# Patient Record
Sex: Female | Born: 1985 | Race: White | Hispanic: No | Marital: Single | Smoking: Current every day smoker
Health system: Southern US, Community
[De-identification: ages and names within clinical notes are randomized; demographics above are authoritative.]

## PROBLEM LIST (undated history)

## (undated) DIAGNOSIS — B1920 Unspecified viral hepatitis C without hepatic coma: Secondary | ICD-10-CM

## (undated) DIAGNOSIS — D329 Benign neoplasm of meninges, unspecified: Secondary | ICD-10-CM

## (undated) HISTORY — PX: TUBAL LIGATION: SHX77

## (undated) HISTORY — PX: MOUTH SURGERY: SHX715

## (undated) HISTORY — PX: OTHER SURGICAL HISTORY: SHX169

---

## 2005-09-24 ENCOUNTER — Emergency Department: Payer: Self-pay | Admitting: General Practice

## 2005-10-05 ENCOUNTER — Emergency Department: Payer: Self-pay | Admitting: Unknown Physician Specialty

## 2006-01-03 ENCOUNTER — Emergency Department: Payer: Self-pay | Admitting: Emergency Medicine

## 2006-02-16 ENCOUNTER — Emergency Department: Payer: Self-pay | Admitting: Emergency Medicine

## 2006-07-14 ENCOUNTER — Ambulatory Visit: Payer: Self-pay | Admitting: Unknown Physician Specialty

## 2006-07-21 ENCOUNTER — Ambulatory Visit: Payer: Self-pay | Admitting: Unknown Physician Specialty

## 2006-12-05 ENCOUNTER — Emergency Department: Payer: Self-pay | Admitting: Emergency Medicine

## 2007-03-02 ENCOUNTER — Emergency Department: Payer: Self-pay | Admitting: Unknown Physician Specialty

## 2007-04-09 ENCOUNTER — Emergency Department: Payer: Self-pay | Admitting: Emergency Medicine

## 2009-03-05 ENCOUNTER — Emergency Department: Payer: Self-pay | Admitting: Unknown Physician Specialty

## 2009-03-06 ENCOUNTER — Emergency Department: Payer: Self-pay | Admitting: Emergency Medicine

## 2009-12-31 ENCOUNTER — Emergency Department: Payer: Self-pay | Admitting: Emergency Medicine

## 2010-11-11 ENCOUNTER — Emergency Department: Payer: Self-pay | Admitting: Emergency Medicine

## 2018-07-17 ENCOUNTER — Emergency Department (HOSPITAL_COMMUNITY)
Admission: EM | Admit: 2018-07-17 | Discharge: 2018-07-18 | Disposition: A | Discharge status: Home / Self Care | Payer: Self-pay | Attending: Emergency Medicine | Admitting: Emergency Medicine

## 2018-07-17 ENCOUNTER — Emergency Department (HOSPITAL_COMMUNITY): Payer: Self-pay

## 2018-07-17 ENCOUNTER — Other Ambulatory Visit: Payer: Self-pay

## 2018-07-17 ENCOUNTER — Encounter (HOSPITAL_COMMUNITY): Payer: Self-pay

## 2018-07-17 DIAGNOSIS — Z91040 Latex allergy status: Secondary | ICD-10-CM | POA: Insufficient documentation

## 2018-07-17 DIAGNOSIS — M25572 Pain in left ankle and joints of left foot: Secondary | ICD-10-CM | POA: Insufficient documentation

## 2018-07-17 DIAGNOSIS — F17210 Nicotine dependence, cigarettes, uncomplicated: Secondary | ICD-10-CM | POA: Insufficient documentation

## 2018-07-17 MED ORDER — NAPROXEN 500 MG PO TABS: 500.0000 mg | ORAL_TABLET | Freq: Two times a day (BID) | ORAL | 0 refills | Status: DC

## 2018-07-17 MED ORDER — HYDROCODONE-ACETAMINOPHEN 5-325 MG PO TABS
1.0000 | ORAL_TABLET | Freq: Once | ORAL | Status: AC
  Administered 2018-07-18: 1 via ORAL
  Filled 2018-07-17: qty 1

## 2018-07-17 NOTE — ED Provider Notes (Signed)
[facility] EMERGENCY DEPARTMENT Provider Note   CSN: [id number] Arrival date & time: 07/17/18  2214     History   Chief Complaint Chief Complaint  Patient presents with  . Ankle Pain    HPI Shannon Blankenship is a 32 y.o. female with no significant past medical history presents emergency department today for left ankle pain.  Patient reports that she was "playing in the woods" when she rolled her left ankle in inversion fashion just prior to arrival.  She notes that she now has pain over the lateral malleolus that is worsened with palpation, movement of the ankle as well as ambulation.  She denies any foot pain, proximal leg pain/knee pain.  No numbness/tingling/weakness.  She reports she has broken this ankle in the past but is never required surgery.  No interventions prior to arrival.  No open wounds.  No further injury.  HPI  History reviewed. No pertinent past medical history.  There are no active problems to display for this patient.   Past Surgical History:  Procedure Laterality Date  . c-section    . MOUTH SURGERY    . TUBAL LIGATION       OB History   None      Home Medications    Prior to Admission medications   Not on File    Family History No family history on file.  Social History Social History   Tobacco Use  . Smoking status: Current Every Day Smoker    Packs/day: 1.00    Types: Cigarettes  . Smokeless tobacco: Never Used  Substance Use Topics  . Alcohol use: Never    Frequency: Never  . Drug use: Never     Allergies   Sulfa antibiotics and Latex   Review of Systems Review of Systems  Constitutional: Negative for fever.  Cardiovascular: Negative for leg swelling.  Musculoskeletal: Positive for arthralgias.  Skin: Negative for wound.  Neurological: Negative for weakness and numbness.     Physical Exam Updated Vital Signs BP 132/78 (BP Location: Right Arm)   Pulse 76   Temp 98.3 F (36.8 C) (Oral)   Resp 18   Ht 5\' 2"   (1.575 m)   Wt 70.3 kg   SpO2 100%   BMI 28.35 kg/m   Physical Exam  Constitutional: She appears well-developed and well-nourished.  HENT:  Head: Normocephalic and atraumatic.  Right Ear: External ear normal.  Left Ear: External ear normal.  Eyes: Conjunctivae are normal. Right eye exhibits no discharge. Left eye exhibits no discharge. No scleral icterus.  Cardiovascular:  Pulses:      Dorsalis pedis pulses are 2+ on the left side.       Posterior tibial pulses are 2+ on the left side.  Pulmonary/Chest: Effort normal. No respiratory distress.  Musculoskeletal:       Left knee: Normal. No tenderness found.       Left ankle: She exhibits swelling. She exhibits no ecchymosis. Decreased range of motion: able rom, decreased 2/2 pain. Tenderness. Lateral malleolus tenderness found. No posterior TFL and no head of 5th metatarsal tenderness found. Achilles tendon normal. Achilles tendon exhibits no pain and normal Thompson's test results.       Left lower leg: Normal.       Left foot: Normal. There is normal range of motion, no tenderness, no bony tenderness, no swelling, no deformity and no laceration.  Neurovascularly intact distally. Compartments soft   Neurological: She is alert. She has normal strength. No sensory  deficit.  Skin: Skin is warm, dry and intact. Capillary refill takes less than 2 seconds. No abrasion and no laceration noted. No pallor.  Psychiatric: She has a normal mood and affect.  Nursing note and vitals reviewed.    ED Treatments / Results  Labs (all labs ordered are listed, but only abnormal results are displayed) Labs Reviewed - No data to display  EKG None  Radiology Dg Ankle Complete Left  Result Date: 07/17/2018 CLINICAL DATA:  Left ankle pain, swelling EXAM: LEFT ANKLE COMPLETE - 3+ VIEW COMPARISON:  None. FINDINGS: There is no evidence of fracture, dislocation, or joint effusion. There is no evidence of arthropathy or other focal bone abnormality. Soft  tissues are unremarkable. IMPRESSION: Negative. Electronically Signed   By: Rolm Baptise M.D.   On: 07/17/2018 22:54    Procedures Procedures (including critical care time)  Medications Ordered in ED Medications  HYDROcodone-acetaminophen (NORCO/VICODIN) 5-325 MG per tablet 1 tablet (has no administration in time range)     Initial Impression / Assessment and Plan / ED Course  I have reviewed the triage vital signs and the nursing notes.  Pertinent labs & imaging results that were available during my care of the patient were reviewed by me and considered in my medical decision making (see chart for details).     32 y.o. female with likely ankle sprain.  She reports inversion ankle injury just prior to arrival.  There is no open wounds.  She is tenderness over the lateral malleolus.  X-rays are without evidence of fracture dislocation.  There is no tenderness palpation of the proximal tib/fib, knee or foot were not further x-rays.  She is neurovascular intact.  Compartments are soft.  There is no evidence of Achilles tendon injury.  Patient will be placed in ASO brace and given crutches.  Rice therapy recommended.  Referral to orthopedics given.  Pain treated in the department.  Return precautions discussed.  Final Clinical Impressions(s) / ED Diagnoses   Final diagnoses:  Acute left ankle pain    ED Discharge Orders         Ordered    naproxen (NAPROSYN) 500 MG tablet  2 times daily     07/17/18 2337           Jillyn Ledger, Hershal Coria 07/17/18 2340    Merryl Hacker, MD 07/18/18 [phone]

## 2018-07-17 NOTE — Discharge Instructions (Signed)
Please read and follow all provided instructions.  Your diagnoses today include: Ankle sprain An ankle sprain is an injury to the ligaments that hold the ankle joint together causing them to get stretched or torn. It may take 4-6 weeks to heal fully. Your X-ray today showed no evidence of fracture (there is no evidence of broken bones).  For activity: Use crutches with non-weightbearing for the first few days. Exercises should be limited to pain free range of motion. You can start mobilization by tracing the alphabet with you foot in the air. Then, you may walk on your ankles as the pain allows (or as instructed). Start gradually with weight bearing on the affected ankle. Once you can walk pain free, then try jogging. When you can run forwards, then you can try moving side to side. If you cannot walk without crutches in one week, you need a recheck by your Family Doctor. It is important to keep all follow-up appointments as we discussed fractures may not appear until 1 week to 10 days after the acute injury. If you do not have a family doctor to follow-up with, you can see the list of phone numbers below. Please call today to make a followup appointment.  TREATMENT  Rest, ice, compression and elevation (RICE therapy) are the basic modes of treatment.   Rest is needed to allow your body to heal. Routine activities can be resumed when comfortable (as described above). Injury tendons and bones can take up to 6 weeks to heal. Tendons are cordlike structures that attach muscles and bones Ice: Apply ice to the sore area for 15 to 20 minutes, 3 to 4 times per day. Do this while you are awake for the first 2 days, or as directed. This can help reduce swelling and reduce pain.  Compression: this helps keep swelling down. It also gives support and helps with discomfort. If any lasting bandage has been applied, it should be removed and reapplied every 3-4 hours. It should not be applied tightly, but firmly enough to  keep swelling down. Watch fingers or toes for swelling, discoloration, coldness, numbness or excessive pain. If any of these problems occur, removed the bandage and reapply loosely. Contact your caregiver if these problems continue. If you were given an ankle stabilizer you may take it off at night and to take a shower or bath. Wiggle your toes in the splint several times per day if you are able.  Elevation helps reduce swelling and decrease your pain. With extremities such as the arms, hands, legs and feet, the injured area should be placed near or above the level of the heart if possible (place pillows underneath you leg/foot while you sleep to achieve this).  HOW TO MAKE AN ICE PACK  To make an ice pack, do one of the following:  Place crushed ice or a bag of frozen vegetables in a sealable plastic bag. Squeeze out the excess air. Place this bag inside another plastic bag. Slide the bag into a pillowcase or place a damp towel between your skin and the bag.  Mix 3 parts water with 1 part rubbing alcohol. Freeze the mixture in a sealable plastic bag. When you remove the mixture from the freezer, it will be slushy. Squeeze out the excess air. Place this bag inside another plastic bag. Slide the bag into a pillowcase or place a damp towel between your sk  Seek immediate medical attention if: You're toes are numb or tingling, appear gray or blue, or  you have severe pain. If this occurs please also elevate the leg and loosen the splint. Also if you have persistent pain and swelling, developed redness numbness or unexpected weakness, or your symptoms are getting worse rather than improving after several days. The symptoms may indicate that further evaluation or further x-rays are needed. Sometimes, x-rays may not show a small broken bone until one week or 10 days later. Make a follow-up appointment with your caregiver.  Additional Information:   Take naproxen with food. Do not take if you think you may be  pregnant  Your vital signs today were: BP 132/78 (BP Location: Right Arm)    Pulse 76    Temp 98.3 F (36.8 C) (Oral)    Resp 18    Ht 5\' 2"  (1.575 m)    Wt 70.3 kg    SpO2 100%    BMI 28.35 kg/m  If your blood pressure (BP) was elevated above 135/85 this visit, please have this repeated by your doctor within one month. ---------------

## 2018-07-17 NOTE — ED Triage Notes (Signed)
Pt came to ED with complaints of left ankle pain. Pt states she broke her ankle a couple months ago and today she "rolled her ankle." Pt states she has noticed swelling and pain in the ankle for 2 weeks.

## 2018-07-18 NOTE — ED Notes (Signed)
Pt demonstrated correct use of crutches

## 2018-07-29 ENCOUNTER — Other Ambulatory Visit: Payer: Self-pay

## 2018-07-29 ENCOUNTER — Emergency Department (HOSPITAL_COMMUNITY)
Admission: EM | Admit: 2018-07-29 | Discharge: 2018-07-29 | Disposition: A | Discharge status: Home / Self Care | Payer: Self-pay | Attending: Emergency Medicine | Admitting: Emergency Medicine

## 2018-07-29 ENCOUNTER — Encounter (HOSPITAL_COMMUNITY): Payer: Self-pay

## 2018-07-29 DIAGNOSIS — R102 Pelvic and perineal pain: Secondary | ICD-10-CM | POA: Insufficient documentation

## 2018-07-29 DIAGNOSIS — N938 Other specified abnormal uterine and vaginal bleeding: Secondary | ICD-10-CM | POA: Insufficient documentation

## 2018-07-29 DIAGNOSIS — Z91040 Latex allergy status: Secondary | ICD-10-CM | POA: Insufficient documentation

## 2018-07-29 DIAGNOSIS — N939 Abnormal uterine and vaginal bleeding, unspecified: Secondary | ICD-10-CM

## 2018-07-29 DIAGNOSIS — F17210 Nicotine dependence, cigarettes, uncomplicated: Secondary | ICD-10-CM | POA: Insufficient documentation

## 2018-07-29 LAB — CBC
HCT: 39.6 % (ref 36.0–46.0)
Hemoglobin: 13.2 g/dL (ref 12.0–15.0)
MCH: 28.4 pg (ref 26.0–34.0)
MCHC: 33.3 g/dL (ref 30.0–36.0)
MCV: 85.2 fL (ref 80.0–100.0)
Platelets: 353 10*3/uL (ref 150–400)
RBC: 4.65 MIL/uL (ref 3.87–5.11)
RDW: 18.4 % — ABNORMAL HIGH (ref 11.5–15.5)
WBC: 12.6 10*3/uL — AB (ref 4.0–10.5)
nRBC: 0 % (ref 0.0–0.2)

## 2018-07-29 LAB — URINALYSIS, ROUTINE W REFLEX MICROSCOPIC
BILIRUBIN URINE: NEGATIVE
Bacteria, UA: NONE SEEN
Glucose, UA: NEGATIVE mg/dL
Ketones, ur: NEGATIVE mg/dL
Leukocytes, UA: NEGATIVE
NITRITE: NEGATIVE
Protein, ur: NEGATIVE mg/dL
Specific Gravity, Urine: 1.009 (ref 1.005–1.030)
pH: 6 (ref 5.0–8.0)

## 2018-07-29 LAB — I-STAT BETA HCG BLOOD, ED (MC, WL, AP ONLY): I-stat hCG, quantitative: <5 m[IU]/mL (ref <5)

## 2018-07-29 NOTE — ED Triage Notes (Signed)
Pt states she is approx [redacted] weeks pregnant, states she started passing blood approx 5 pm with some small clots.  Pt also having some cramping.

## 2018-07-29 NOTE — Discharge Instructions (Signed)
Ibuprofen for pain Health department tomorrow for further counseling on pregnancy prevention

## 2018-07-29 NOTE — ED Provider Notes (Signed)
[facility] EMERGENCY DEPARTMENT Provider Note   CSN: [id number] Arrival date & time: 07/29/18  0001    History   Chief Complaint Chief Complaint  Patient presents with  . Vaginal Bleeding    HPI Shannon Blankenship is a 32 y.o. female.  HPI  The patient is a 32 year old female, she has a prior history of a cesarean section that was complicated, she went into cardiac arrest, she subsequently had a tubal ligation in the state of [place].  That was in 2014.  Since that time the patient reports that she has been pregnant twice, initially last year where she carried a baby to [redacted] weeks gestation and subsequently this month where she took an at home pregnancy test, had this pregnancy confirmed in the office and was scheduled for further testing.  That being said the patient this evening developed acute onset of pain and bleeding in her pelvis both the right and left side.  The bleeding was initially very heavily, it then became lighter.  The cramping has persisted.  It is not associated with vomiting diarrhea or fevers.  She has been told that she has lots of scar tissue in her pelvis tubes and uterus because of the prior surgeries and tubal ligations.  History reviewed. No pertinent past medical history.  There are no active problems to display for this patient.   Past Surgical History:  Procedure Laterality Date  . c-section    . MOUTH SURGERY    . TUBAL LIGATION       OB History    Gravida  1   Para      Term      Preterm      AB      Living        SAB      TAB      Ectopic      Multiple      Live Births               Home Medications    Prior to Admission medications   Medication Sig Start Date End Date Taking? Authorizing Provider  naproxen (NAPROSYN) 500 MG tablet Take 1 tablet (500 mg total) by mouth 2 (two) times daily. 07/17/18   Maczis, Barth Kirks, PA-C    Family History No family history on file.  Social History Social History    Tobacco Use  . Smoking status: Current Every Day Smoker    Packs/day: 1.00    Types: Cigarettes  . Smokeless tobacco: Never Used  Substance Use Topics  . Alcohol use: Never    Frequency: Never  . Drug use: Never     Allergies   Sulfa antibiotics and Latex   Review of Systems Review of Systems  All other systems reviewed and are negative.    Physical Exam Updated Vital Signs BP 131/75 (BP Location: Left Arm)   Pulse 70   Temp 97.9 F (36.6 C) (Oral)   Resp 16   Ht 1.575 m (5\' 2" )   Wt 70.3 kg   LMP 06/12/2018 (Exact Date)   SpO2 99%   BMI 28.35 kg/m   Physical Exam  Constitutional: She appears well-developed and well-nourished. No distress.  HENT:  Head: Normocephalic and atraumatic.  Mouth/Throat: Oropharynx is clear and moist. No oropharyngeal exudate.  Eyes: Pupils are equal, round, and reactive to light. Conjunctivae and EOM are normal. Right eye exhibits no discharge. Left eye exhibits no discharge. No scleral icterus.  Neck: Normal  range of motion. Neck supple. No JVD present. No thyromegaly present.  Cardiovascular: Normal rate, regular rhythm, normal heart sounds and intact distal pulses. Exam reveals no gallop and no friction rub.  No murmur heard. Pulmonary/Chest: Effort normal and breath sounds normal. No respiratory distress. She has no wheezes. She has no rales.  Abdominal: Soft. Bowel sounds are normal. She exhibits no distension and no mass. There is tenderness ( There is focal tenderness to palpation in the lower abdomen as well as the right and left lower quadrants, no upper abdominal tenderness, very soft, no peritoneal signs).  Genitourinary:  Genitourinary Comments: Chaperone present for exam, normal-appearing external genitalia, internal vaginal vault with small to moderate amount of blood, cervical loss is slightly open, blood present, no foreign bodies, tenderness present  Musculoskeletal: Normal range of motion. She exhibits no edema or  tenderness.  Lymphadenopathy:    She has no cervical adenopathy.  Neurological: She is alert. Coordination normal.  Skin: Skin is warm and dry. No rash noted. No erythema.  Psychiatric: She has a normal mood and affect. Her behavior is normal.  Nursing note and vitals reviewed.    ED Treatments / Results  Labs (all labs ordered are listed, but only abnormal results are displayed) Labs Reviewed  URINALYSIS, ROUTINE W REFLEX MICROSCOPIC  CBC  I-STAT BETA HCG BLOOD, ED ([facility], [facility], [facility] ONLY)  ABO/RH    Radiology No results found.  Procedures Procedures (including critical care time)  Medications Ordered in ED Medications - No data to display   Initial Impression / Assessment and Plan / ED Course  I have reviewed the triage vital signs and the nursing notes.  Pertinent labs & imaging results that were available during my care of the patient were reviewed by me and considered in my medical decision making (see chart for details).  Clinical Course as of Jul 29 52  Sat Jul 29, 2018  0051 hCG quant came back undetectable, the patient has no signs of pregnancy at this time.  This is likely menstrual bleeding.  She now states that her initial test was positive but a subsequent test was negative.  At this time I do not think ectopic pregnancy is a concern.  I have done a bedside ultrasound, the images were not archived but there is no free fluid in the pelvis, bladder appeared normal, uterus appeared normal.  Patient was given reassurance.  She will follow-up to have further counseling on further contraceptive use to prevent pregnancy and complicated pregnancy.   [BM]    Clinical Course User Index [BM] Noemi Chapel, MD    The patient will need an ectopic evaluation. Ultrasound technician called in, pain and bleeding on pelvic exam  Final Clinical Impressions(s) / ED Diagnoses   Final diagnoses:  Vaginal bleeding      Noemi Chapel, MD 07/29/18 [phone]

## 2018-11-05 ENCOUNTER — Other Ambulatory Visit: Payer: Self-pay

## 2018-11-05 ENCOUNTER — Emergency Department
Admission: EM | Admit: 2018-11-05 | Discharge: 2018-11-05 | Disposition: A | Discharge status: Home / Self Care | Payer: Self-pay | Attending: Emergency Medicine | Admitting: Emergency Medicine

## 2018-11-05 DIAGNOSIS — N898 Other specified noninflammatory disorders of vagina: Secondary | ICD-10-CM | POA: Insufficient documentation

## 2018-11-05 DIAGNOSIS — R350 Frequency of micturition: Secondary | ICD-10-CM | POA: Insufficient documentation

## 2018-11-05 DIAGNOSIS — Z91040 Latex allergy status: Secondary | ICD-10-CM | POA: Insufficient documentation

## 2018-11-05 DIAGNOSIS — F17210 Nicotine dependence, cigarettes, uncomplicated: Secondary | ICD-10-CM | POA: Insufficient documentation

## 2018-11-05 LAB — URINALYSIS, COMPLETE (UACMP) WITH MICROSCOPIC
BACTERIA UA: NONE SEEN
Bilirubin Urine: NEGATIVE
Glucose, UA: NEGATIVE mg/dL
KETONES UR: NEGATIVE mg/dL
LEUKOCYTES UA: NEGATIVE
NITRITE: NEGATIVE
PROTEIN: NEGATIVE mg/dL
Specific Gravity, Urine: 1.028 (ref 1.005–1.030)
pH: 5 (ref 5.0–8.0)

## 2018-11-05 LAB — POCT PREGNANCY, URINE: PREG TEST UR: NEGATIVE

## 2018-11-05 NOTE — ED Triage Notes (Signed)
Pt c/o hx of kidney stones - reports dysuria x2 days with urgency/frequency and voiding in small amount

## 2018-11-05 NOTE — ED Notes (Signed)
See triage note   Presents with diff voiding   States sx's started 2 days ago  No fever or vaginal discharge

## 2018-11-05 NOTE — Discharge Instructions (Addendum)
Follow-up with your primary care provider at [facility] clinic if any continued problems.  Your urinalysis today in the ED did not show any signs of infection or signs of kidney stone. Also consider using a menstrual disc however read the information on the package to make sure that it does not contain latex as this may be part of the irritation that you are experiencing.

## 2018-11-05 NOTE — ED Provider Notes (Signed)
[facility] Emergency Department Provider Note  ____________________________________________   First MD Initiated Contact with Patient 11/05/18 0831     (approximate)  I have reviewed the triage vital signs and the nursing notes.   HISTORY  Chief Complaint Dysuria   HPI Shannon Blankenship is a 33 y.o. female presents to the ED today with complaint of dysuria for 2 days with initial history of frequency and voiding in small amounts.  Patient denied any fever or vaginal discharge.  She states symptoms began 2 days ago.  When talking to her and more depth she states that this occurs each time she has her menses and that she actually has irritation without urinary symptoms.  She is concerned that she is having some problems with pads and has discontinued using tampons as they are too painful.    History reviewed. No pertinent past medical history.  There are no active problems to display for this patient.   Past Surgical History:  Procedure Laterality Date  . c-section    . MOUTH SURGERY    . TUBAL LIGATION      Prior to Admission medications   Not on File    Allergies Sulfa antibiotics and Latex  No family history on file.  Social History Social History   Tobacco Use  . Smoking status: Current Every Day Smoker    Packs/day: 1.00    Types: Cigarettes  . Smokeless tobacco: Never Used  Substance Use Topics  . Alcohol use: Never    Frequency: Never  . Drug use: Never    Review of Systems Constitutional: No fever/chills Cardiovascular: Denies chest pain. Respiratory: Denies shortness of breath. Gastrointestinal: No abdominal pain.  No nausea, no vomiting.  No diarrhea.  No constipation. Genitourinary: Negative for dysuria.  Positive for vaginal irritation.  Negative for vaginal discharge or itching. Musculoskeletal: Negative for back pain. Skin: Negative for rash. Neurological: Negative for headaches, focal weakness or  numbness. ____________________________________________   PHYSICAL EXAM:  VITAL SIGNS: ED Triage Vitals  Enc Vitals Group     BP 11/05/18 0824 119/77     Pulse Rate 11/05/18 0824 74     Resp 11/05/18 0824 16     Temp 11/05/18 0824 98.1 F (36.7 C)     Temp Source 11/05/18 0824 Oral     SpO2 11/05/18 0824 99 %     Weight 11/05/18 0822 151 lb (68.5 kg)     Height 11/05/18 0822 5\' 2"  (1.575 m)     Head Circumference --      Peak Flow --      Pain Score 11/05/18 0821 7     Pain Loc --      Pain Edu? --      Excl. in [place]? --    Constitutional: Alert and oriented. Well appearing and in no acute distress. Eyes: Conjunctivae are normal. PERRL. EOMI. Head: Atraumatic. Nose: No congestion/rhinnorhea. Neck: No stridor.   Respiratory: Normal respiratory effort.  No retractions. Musculoskeletal: Moves upper and lower extremities and normal gait was noted. Neurologic:  Normal speech and language. No gross focal neurologic deficits are appreciated.  Skin:  Skin is warm, dry and intact.  Psychiatric: Mood and affect are normal. Speech and behavior are normal.  ____________________________________________   LABS (all labs ordered are listed, but only abnormal results are displayed)  Labs Reviewed  URINALYSIS, COMPLETE (UACMP) WITH MICROSCOPIC - Abnormal; Notable for the following components:      Result Value  Color, Urine YELLOW (*)    APPearance HAZY (*)    Hgb urine dipstick SMALL (*)    All other components within normal limits  POC URINE PREG, ED  POCT PREGNANCY, URINE     PROCEDURES  Procedure(s) performed: None  Procedures  Critical Care performed: No  ____________________________________________   INITIAL IMPRESSION / ASSESSMENT AND PLAN / ED COURSE  As part of my medical decision making, I reviewed the following data within the electronic MEDICAL RECORD NUMBER Notes from prior ED visits and [place] Controlled Substance Database  Patient presents to the ED initially  with history of dysuria for 2 days.  In talking with her it is more of a irritation due to wearing pads which may contain latex.  Patient states that she has allergies to latex and experiences irritation each time she has her period every month.  We discussed other options for her to look into.  Urinalysis was reassuring and patient was made aware.  ____________________________________________   FINAL CLINICAL IMPRESSION(S) / ED DIAGNOSES  Final diagnoses:  Vaginal irritation     ED Discharge Orders    None       Note:  This document was prepared using Dragon voice recognition software and may include unintentional dictation errors.    Johnn Hai, PA-C 11/05/18 1210    Delman Kitten, MD 11/05/18 [phone]

## 2019-01-23 ENCOUNTER — Other Ambulatory Visit: Payer: Self-pay

## 2019-01-23 ENCOUNTER — Emergency Department
Admission: EM | Admit: 2019-01-23 | Discharge: 2019-01-23 | Disposition: A | Discharge status: Home / Self Care | Payer: HRSA Program | Attending: Emergency Medicine | Admitting: Emergency Medicine

## 2019-01-23 ENCOUNTER — Emergency Department: Payer: HRSA Program

## 2019-01-23 DIAGNOSIS — J329 Chronic sinusitis, unspecified: Secondary | ICD-10-CM | POA: Diagnosis not present

## 2019-01-23 DIAGNOSIS — R0981 Nasal congestion: Secondary | ICD-10-CM | POA: Diagnosis present

## 2019-01-23 DIAGNOSIS — R519 Headache, unspecified: Secondary | ICD-10-CM

## 2019-01-23 DIAGNOSIS — F17210 Nicotine dependence, cigarettes, uncomplicated: Secondary | ICD-10-CM | POA: Diagnosis not present

## 2019-01-23 DIAGNOSIS — Z91040 Latex allergy status: Secondary | ICD-10-CM | POA: Diagnosis not present

## 2019-01-23 DIAGNOSIS — H9202 Otalgia, left ear: Secondary | ICD-10-CM

## 2019-01-23 DIAGNOSIS — R51 Headache: Secondary | ICD-10-CM | POA: Diagnosis not present

## 2019-01-23 DIAGNOSIS — H9209 Otalgia, unspecified ear: Secondary | ICD-10-CM | POA: Insufficient documentation

## 2019-01-23 DIAGNOSIS — Z20828 Contact with and (suspected) exposure to other viral communicable diseases: Secondary | ICD-10-CM | POA: Diagnosis not present

## 2019-01-23 MED ORDER — HYDROCOD POLST-CPM POLST ER 10-8 MG/5ML PO SUER: 5.0000 mL | Freq: Two times a day (BID) | ORAL | 0 refills | Status: DC

## 2019-01-23 MED ORDER — AZITHROMYCIN 250 MG PO TABS: ORAL_TABLET | ORAL | 0 refills | Status: DC

## 2019-01-23 MED ORDER — ONDANSETRON 4 MG PO TBDP
4.0000 mg | ORAL_TABLET | Freq: Once | ORAL | Status: AC
  Administered 2019-01-23: 11:00:00 4 mg via ORAL
  Filled 2019-01-23: qty 1

## 2019-01-23 MED ORDER — BUTALBITAL-APAP-CAFFEINE 50-325-40 MG PO TABS: 1.0000 | ORAL_TABLET | Freq: Four times a day (QID) | ORAL | 0 refills | Status: AC | PRN

## 2019-01-23 MED ORDER — KETOROLAC TROMETHAMINE 60 MG/2ML IM SOLN
60.0000 mg | Freq: Once | INTRAMUSCULAR | Status: AC
  Administered 2019-01-23: 60 mg via INTRAMUSCULAR
  Filled 2019-01-23: qty 2

## 2019-01-23 NOTE — ED Triage Notes (Signed)
Pt c/o sinus congestion with ear ache for the past several days, since last night having a fever with N/V.Shannon Blankenship

## 2019-01-23 NOTE — ED Notes (Signed)
Pt states migraine x 3 days with N&V. C/o L ear pain. Biggest c/o is L ear pain with nasal congestion. A&O, ambulatory. No distress noted.   EDP at bedside.

## 2019-01-23 NOTE — ED Provider Notes (Signed)
[facility] Emergency Department Provider Note       Time seen: ----------------------------------------- 10:11 AM on 01/23/2019 -----------------------------------------   I have reviewed the triage vital signs and the nursing notes.  HISTORY   Chief Complaint Otalgia; Fever; and Cough    HPI Shannon Blankenship is a 33 y.o. female with no significant past medical history who presents to the ED for sinus congestion with earache for the past several days.  Patient states last night she started having a fever with nausea and vomiting.  Patient feels like she is developing a migraine headache.  No past medical history on file.  There are no active problems to display for this patient.   Past Surgical History:  Procedure Laterality Date  . c-section    . MOUTH SURGERY    . TUBAL LIGATION      Allergies Sulfa antibiotics and Latex  Social History Social History   Tobacco Use  . Smoking status: Current Every Day Smoker    Packs/day: 1.00    Types: Cigarettes  . Smokeless tobacco: Never Used  Substance Use Topics  . Alcohol use: Never    Frequency: Never  . Drug use: Never   Review of Systems Constitutional: Positive for fever HEENT: Positive for earache, congestion Cardiovascular: Negative for chest pain. Respiratory: Negative for shortness of breath. Gastrointestinal: Positive for vomiting Musculoskeletal: Negative for back pain. Skin: Negative for rash. Neurological: Negative for headaches, focal weakness or numbness.  All systems negative/normal/unremarkable except as stated in the HPI  ____________________________________________   PHYSICAL EXAM:  VITAL SIGNS: ED Triage Vitals  Enc Vitals Group     BP      Pulse      Resp      Temp      Temp src      SpO2      Weight      Height      Head Circumference      Peak Flow      Pain Score      Pain Loc      Pain Edu?      Excl. in [place]?    Constitutional: Alert and  oriented. Well appearing and in no distress. Eyes: Conjunctivae are normal. Normal extraocular movements. ENT      Head: Normocephalic and atraumatic.      Ears: TMs are clear bilaterally      Nose: No congestion/rhinnorhea.      Mouth/Throat: Mucous membranes are moist.      Neck: No stridor. Cardiovascular: Normal rate, regular rhythm. No murmurs, rubs, or gallops. Respiratory: Normal respiratory effort without tachypnea nor retractions. Breath sounds are clear and equal bilaterally. No wheezes/rales/rhonchi. Gastrointestinal: Soft and nontender. Normal bowel sounds Musculoskeletal: Nontender with normal range of motion in extremities. No lower extremity tenderness nor edema. Neurologic:  Normal speech and language. No gross focal neurologic deficits are appreciated.  Skin:  Skin is warm, dry and intact. No rash noted. Psychiatric: Mood and affect are normal. Speech and behavior are normal.  ____________________________________________  ED COURSE:  As part of my medical decision making, I reviewed the following data within the [place] History obtained from family if available, nursing notes, old chart and ekg, as well as notes from prior ED visits. Patient presented for multiple complaints, we will assess with labs and imaging as indicated at this time.   Procedures  Shannon Blankenship was evaluated in Emergency Department on 01/23/2019 for the symptoms described  in the history of present illness. She was evaluated in the context of the global COVID-19 pandemic, which necessitated consideration that the patient might be at risk for infection with the SARS-CoV-2 virus that causes COVID-19. Institutional protocols and algorithms that pertain to the evaluation of patients at risk for COVID-19 are in a state of rapid change based on information released by regulatory bodies including the CDC and federal and state organizations. These policies and algorithms were followed during  the patient's care in the ED.  ____________________________________________   LABS (pertinent positives/negatives)  Labs Reviewed  NOVEL CORONAVIRUS, NAA (HOSPITAL ORDER, SEND-OUT TO REF LAB)    RADIOLOGY Images were viewed by me  Chest x-ray Is unremarkable ____________________________________________   DIFFERENTIAL DIAGNOSIS   Viral illness, sinusitis, otitis media, gastroenteritis  FINAL ASSESSMENT AND PLAN  Sinusitis, otalgia, migraine headache  Plan: The patient had presented for congestion, earache with fever, nausea and vomiting. Patient's imaging not reveal any acute process.  She was given Toradol for headache and Zofran.  She will be discharged with decongestants and is cleared for outpatient follow-up.   Laurence Aly, MD    Note: This note was generated in part or whole with voice recognition software. Voice recognition is usually quite accurate but there are transcription errors that can and very often do occur. I apologize for any typographical errors that were not detected and corrected.     Earleen Newport, MD 01/23/19 1058

## 2019-01-24 LAB — NOVEL CORONAVIRUS, NAA (HOSP ORDER, SEND-OUT TO REF LAB; TAT 18-24 HRS): SARS-CoV-2, NAA: NOT DETECTED

## 2019-01-25 ENCOUNTER — Telehealth: Payer: Self-pay | Admitting: Emergency Medicine

## 2019-01-25 NOTE — Telephone Encounter (Signed)
Called patient and informed her of negative covid 19 test.

## 2019-05-07 ENCOUNTER — Encounter: Payer: Self-pay | Admitting: Emergency Medicine

## 2019-05-07 ENCOUNTER — Other Ambulatory Visit: Payer: Self-pay

## 2019-05-07 DIAGNOSIS — R509 Fever, unspecified: Secondary | ICD-10-CM | POA: Insufficient documentation

## 2019-05-07 DIAGNOSIS — Z5321 Procedure and treatment not carried out due to patient leaving prior to being seen by health care provider: Secondary | ICD-10-CM | POA: Insufficient documentation

## 2019-05-07 NOTE — ED Triage Notes (Signed)
Patient ambulatory to triage with steady gait, without difficulty or distress noted; pt reports fever, prod cough green sputum and chills, st broke 2 teeth this week as well and having dental pain (rt upper side) multiple dental cavities noted--st rx antibiotics at [facility] last week but "couldn't afford them"; +smoker

## 2019-05-07 NOTE — ED Notes (Signed)
Pt left ED lobby

## 2019-05-08 ENCOUNTER — Emergency Department
Admission: EM | Admit: 2019-05-08 | Discharge: 2019-05-08 | Disposition: A | Discharge status: Home / Self Care | Payer: Self-pay | Attending: Emergency Medicine | Admitting: Emergency Medicine

## 2019-05-08 NOTE — ED Notes (Signed)
No answer when called x 2

## 2019-06-16 ENCOUNTER — Emergency Department
Admission: EM | Admit: 2019-06-16 | Discharge: 2019-06-16 | Disposition: A | Discharge status: Home / Self Care | Payer: Self-pay | Attending: Emergency Medicine | Admitting: Emergency Medicine

## 2019-06-16 ENCOUNTER — Other Ambulatory Visit: Payer: Self-pay

## 2019-06-16 ENCOUNTER — Emergency Department: Payer: Self-pay

## 2019-06-16 DIAGNOSIS — Z91040 Latex allergy status: Secondary | ICD-10-CM | POA: Insufficient documentation

## 2019-06-16 DIAGNOSIS — R112 Nausea with vomiting, unspecified: Secondary | ICD-10-CM | POA: Insufficient documentation

## 2019-06-16 DIAGNOSIS — R1084 Generalized abdominal pain: Secondary | ICD-10-CM | POA: Insufficient documentation

## 2019-06-16 DIAGNOSIS — F17210 Nicotine dependence, cigarettes, uncomplicated: Secondary | ICD-10-CM | POA: Insufficient documentation

## 2019-06-16 HISTORY — DX: Benign neoplasm of meninges, unspecified: D32.9

## 2019-06-16 HISTORY — DX: Unspecified viral hepatitis C without hepatic coma: B19.20

## 2019-06-16 LAB — URINALYSIS, COMPLETE (UACMP) WITH MICROSCOPIC
Bilirubin Urine: NEGATIVE
Glucose, UA: NEGATIVE mg/dL
Hgb urine dipstick: NEGATIVE
Ketones, ur: NEGATIVE mg/dL
Leukocytes,Ua: NEGATIVE
Nitrite: NEGATIVE
Protein, ur: 30 mg/dL — AB
Specific Gravity, Urine: 1.017 (ref 1.005–1.030)
Squamous Epithelial / HPF: >50 — ABNORMAL HIGH (ref 0–5)
pH: 6 (ref 5.0–8.0)

## 2019-06-16 LAB — COMPREHENSIVE METABOLIC PANEL WITH GFR
ALT: 24 U/L (ref 0–44)
AST: 22 U/L (ref 15–41)
Albumin: 3.4 g/dL — ABNORMAL LOW (ref 3.5–5.0)
Alkaline Phosphatase: 78 U/L (ref 38–126)
Anion gap: 8 (ref 5–15)
BUN: 6 mg/dL (ref 6–20)
CO2: 25 mmol/L (ref 22–32)
Calcium: 8.6 mg/dL — ABNORMAL LOW (ref 8.9–10.3)
Chloride: 103 mmol/L (ref 98–111)
Creatinine, Ser: 0.54 mg/dL (ref 0.44–1.00)
GFR calc Af Amer: >60 mL/min
GFR calc non Af Amer: >60 mL/min
Glucose, Bld: 94 mg/dL (ref 70–99)
Potassium: 3 mmol/L — ABNORMAL LOW (ref 3.5–5.1)
Sodium: 136 mmol/L (ref 135–145)
Total Bilirubin: 0.3 mg/dL (ref 0.3–1.2)
Total Protein: 7.3 g/dL (ref 6.5–8.1)

## 2019-06-16 LAB — CBC
HCT: 31.4 % — ABNORMAL LOW (ref 36.0–46.0)
Hemoglobin: 9.6 g/dL — ABNORMAL LOW (ref 12.0–15.0)
MCH: 22.9 pg — ABNORMAL LOW (ref 26.0–34.0)
MCHC: 30.6 g/dL (ref 30.0–36.0)
MCV: 74.9 fL — ABNORMAL LOW (ref 80.0–100.0)
Platelets: 372 K/uL (ref 150–400)
RBC: 4.19 MIL/uL (ref 3.87–5.11)
RDW: 16.9 % — ABNORMAL HIGH (ref 11.5–15.5)
WBC: 6.3 K/uL (ref 4.0–10.5)
nRBC: 0 % (ref 0.0–0.2)

## 2019-06-16 LAB — POCT PREGNANCY, URINE: Preg Test, Ur: NEGATIVE

## 2019-06-16 LAB — AMMONIA: Ammonia: 25 umol/L (ref 9–35)

## 2019-06-16 LAB — LIPASE, BLOOD: Lipase: 16 U/L (ref 11–51)

## 2019-06-16 MED ORDER — SODIUM CHLORIDE 0.9 % IV BOLUS
1000.0000 mL | Freq: Once | INTRAVENOUS | Status: AC
  Administered 2019-06-16: 1000 mL via INTRAVENOUS

## 2019-06-16 MED ORDER — FENTANYL CITRATE (PF) 100 MCG/2ML IJ SOLN
50.0000 ug | Freq: Once | INTRAMUSCULAR | Status: AC
  Administered 2019-06-16: 50 ug via INTRAVENOUS
  Filled 2019-06-16: qty 2

## 2019-06-16 MED ORDER — IOHEXOL 300 MG/ML  SOLN
100.0000 mL | Freq: Once | INTRAMUSCULAR | Status: AC | PRN
  Administered 2019-06-16: 07:00:00 100 mL via INTRAVENOUS

## 2019-06-16 MED ORDER — ONDANSETRON 4 MG PO TBDP: 4.0000 mg | ORAL_TABLET | Freq: Three times a day (TID) | ORAL | 0 refills | Status: DC | PRN

## 2019-06-16 MED ORDER — ONDANSETRON HCL 4 MG/2ML IJ SOLN
4.0000 mg | Freq: Once | INTRAMUSCULAR | Status: AC
  Administered 2019-06-16: 06:00:00 4 mg via INTRAVENOUS
  Filled 2019-06-16: qty 2

## 2019-06-16 MED ORDER — ONDANSETRON HCL 4 MG/2ML IJ SOLN
4.0000 mg | Freq: Once | INTRAMUSCULAR | Status: AC
  Administered 2019-06-16: 08:00:00 4 mg via INTRAVENOUS

## 2019-06-16 MED ORDER — FAMOTIDINE 20 MG PO TABS: 20.0000 mg | ORAL_TABLET | Freq: Two times a day (BID) | ORAL | 0 refills | Status: DC

## 2019-06-16 MED ORDER — ONDANSETRON HCL 4 MG/2ML IJ SOLN
INTRAMUSCULAR | Status: AC
  Filled 2019-06-16: qty 2

## 2019-06-16 MED ORDER — IOHEXOL 9 MG/ML PO SOLN
500.0000 mL | ORAL | Status: AC
  Administered 2019-06-16: 05:00:00 500 mL via ORAL

## 2019-06-16 NOTE — ED Provider Notes (Signed)
[facility] Emergency Department Provider Note   ____________________________________________   First MD Initiated Contact with Patient 06/16/19 [phone]     (approximate)  I have reviewed the triage vital signs and the nursing notes.   HISTORY  Chief Complaint Abdominal Pain    HPI Shannon Blankenship is a 33 y.o. female who presents to the ED from home with a chief complaint of abdominal pain, nausea and vomiting.  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  Has been treated in [place] and has moved back home.  Upcoming appointment with her PCP next week.  Complains of a one-week history of abdominal pain, nausea and vomiting.  Feels like her abdomen is distended.  Denies fever, cough, chest pain, shortness of breath, dysuria, diarrhea.       Past Medical History:  Diagnosis Date  . Hepatitis C   . Meningioma ([place])     There are no active problems to display for this patient.   Past Surgical History:  Procedure Laterality Date  . c-section    . MOUTH SURGERY    . TUBAL LIGATION      Prior to Admission medications   Medication Sig Start Date End Date Taking? Authorizing Provider  azithromycin (ZITHROMAX Z-PAK) 250 MG tablet Take 2 tablets (500 mg) on  Day 1,  followed by 1 tablet (250 mg) once daily on Days 2 through 5. 01/23/19   Earleen Newport, MD  butalbital-acetaminophen-caffeine (FIORICET) [phone] MG tablet Take 1-2 tablets by mouth every 6 (six) hours as needed. 01/23/19 01/23/20  Earleen Newport, MD  chlorpheniramine-HYDROcodone (TUSSIONEX PENNKINETIC ER) 10-8 MG/5ML SUER Take 5 mLs by mouth 2 (two) times daily. 01/23/19   Earleen Newport, MD    Allergies Sulfa antibiotics and Latex  No family history on file.  Social History Social History   Tobacco Use  . Smoking status: Current Every Day Smoker    Packs/day: 1.00    Types: Cigarettes  . Smokeless tobacco: Never Used  Substance Use Topics   . Alcohol use: Never    Frequency: Never  . Drug use: Not Currently    Comment: heroin    Review of Systems  Constitutional: No fever/chills Eyes: No visual changes. ENT: No sore throat. Cardiovascular: Denies chest pain. Respiratory: Denies shortness of breath. Gastrointestinal: Positive for abdominal pain, nausea and vomiting.  No diarrhea.  No constipation. Genitourinary: Negative for dysuria. Musculoskeletal: Negative for back pain. Skin: Negative for rash. Neurological: Negative for headaches, focal weakness or numbness.   ____________________________________________   PHYSICAL EXAM:  VITAL SIGNS: ED Triage Vitals  Enc Vitals Group     BP 06/16/19 0514 (!) 162/89     Pulse Rate 06/16/19 0514 69     Resp 06/16/19 0514 18     Temp 06/16/19 0514 98.2 F (36.8 C)     Temp Source 06/16/19 0514 Oral     SpO2 06/16/19 0514 100 %     Weight 06/16/19 0510 151 lb (68.5 kg)     Height 06/16/19 0510 5\' 2"  (1.575 m)     Head Circumference --      Peak Flow --      Pain Score 06/16/19 0510 7     Pain Loc --      Pain Edu? --      Excl. in [place]? --     Constitutional: Alert and oriented. Well appearing and in no acute distress. Eyes: Conjunctivae are normal. PERRL. EOMI.  Head: Atraumatic. Nose: No congestion/rhinnorhea. Mouth/Throat: Mucous membranes are moist.  Oropharynx non-erythematous. Neck: No stridor.   Cardiovascular: Normal rate, regular rhythm. Grossly normal heart sounds.  Good peripheral circulation. Respiratory: Normal respiratory effort.  No retractions. Lungs CTAB. Gastrointestinal: Soft with mild diffuse tenderness to palpation, maximally in upper abdomen. No distention. No abdominal bruits. No CVA tenderness. Musculoskeletal: No lower extremity tenderness nor edema.  No joint effusions. Neurologic:  Normal speech and language. No gross focal neurologic deficits are appreciated. No gait instability. Skin:  Skin is warm, dry and intact. No rash noted.  Psychiatric: Mood and affect are normal. Speech and behavior are normal.  ____________________________________________   LABS (all labs ordered are listed, but only abnormal results are displayed)  Labs Reviewed  COMPREHENSIVE METABOLIC PANEL - Abnormal; Notable for the following components:      Result Value   Potassium 3.0 (*)    Calcium 8.6 (*)    Albumin 3.4 (*)    All other components within normal limits  CBC - Abnormal; Notable for the following components:   Hemoglobin 9.6 (*)    HCT 31.4 (*)    MCV 74.9 (*)    MCH 22.9 (*)    RDW 16.9 (*)    All other components within normal limits  URINALYSIS, COMPLETE (UACMP) WITH MICROSCOPIC - Abnormal; Notable for the following components:   Color, Urine YELLOW (*)    APPearance CLOUDY (*)    Protein, ur 30 (*)    Bacteria, UA RARE (*)    Squamous Epithelial / LPF >50 (*)    All other components within normal limits  LIPASE, BLOOD  AMMONIA  POC URINE PREG, ED  POCT PREGNANCY, URINE   ____________________________________________  EKG  ED ECG REPORT I, SUNG,JADE J, the attending physician, personally viewed and interpreted this ECG.   Date: 06/16/2019  EKG Time: 0515  Rate: 60  Rhythm: normal EKG, normal sinus rhythm  Axis: Normal  Intervals:none  ST&T Change: Nonspecific  ____________________________________________  RADIOLOGY  ED MD interpretation: Pending  Official radiology report(s): No results found.  ____________________________________________   PROCEDURES  Procedure(s) performed (including Critical Care):  Procedures   ____________________________________________   INITIAL IMPRESSION / ASSESSMENT AND PLAN / ED COURSE  As part of my medical decision making, I reviewed the following data within the [place] notes reviewed and incorporated, Labs reviewed, Radiograph reviewed, Notes from prior ED visits and [place] Controlled Substance Database     Shannon Blankenship was  evaluated in Emergency Department on 06/16/2019 for the symptoms described in the history of present illness. She was evaluated in the context of the global COVID-19 pandemic, which necessitated consideration that the patient might be at risk for infection with the SARS-CoV-2 virus that causes COVID-19. Institutional protocols and algorithms that pertain to the evaluation of patients at risk for COVID-19 are in a state of rapid change based on information released by regulatory bodies including the CDC and federal and state organizations. These policies and algorithms were followed during the patient's care in the ED.    33 year old female with hepatitis who presents with abdominal pain. Differential diagnosis includes, but is not limited to, biliary disease (biliary colic, acute cholecystitis, cholangitis, choledocholithiasis, etc), intrathoracic causes for epigastric abdominal pain including ACS, gastritis, duodenitis, pancreatitis, small bowel or large bowel obstruction, abdominal aortic aneurysm, hernia, and ulcer(s).  We will obtain basic lab work, urinalysis.  Given generalized nature of her pain, will proceed with CT abdomen/pelvis.  Administer IV  fentanyl and Zofran for pain and nausea.  Clinical Course as of Jun 15 657  Sat Jun 16, 2019  0656 Patient in CT scan.  Care transferred to Dr. Joni Fears at change of shift.  Disposition per CT scan.   [JS]    Clinical Course User Index [JS] Paulette Blanch, MD     ____________________________________________   FINAL CLINICAL IMPRESSION(S) / ED DIAGNOSES  Final diagnoses:  Generalized abdominal pain     ED Discharge Orders    None       Note:  This document was prepared using Dragon voice recognition software and may include unintentional dictation errors.   Paulette Blanch, MD 06/16/19 [phone]

## 2019-06-16 NOTE — ED Provider Notes (Signed)
Procedures  Clinical Course as of Jun 15 841  Sat Jun 16, 2019  0656 Patient in CT scan.  Care transferred to Dr. Joni Fears at change of shift.  Disposition per CT scan.   [JS]    Clinical Course User Index [JS] Paulette Blanch, MD    ----------------------------------------- 8:42 AM on 06/16/2019 -----------------------------------------  CT scan unremarkable, shows liver hemangioma which is a chronic finding previously known about by the patient.  Vital signs remain normal, patient is calm and comfortable and eager to be discharged home.  Recommended close follow-up this week with her primary care doctor to continue monitoring symptoms.  Pepcid and Zofran for symptom control in the meantime.   Carrie Mew, MD 06/16/19 [phone]

## 2019-06-16 NOTE — Discharge Instructions (Signed)
Results for orders placed or performed during the hospital encounter of 06/16/19  Lipase, blood  Result Value Ref Range   Lipase 16 11 - 51 U/L  Comprehensive metabolic panel  Result Value Ref Range   Sodium 136 135 - 145 mmol/L   Potassium 3.0 (L) 3.5 - 5.1 mmol/L   Chloride 103 98 - 111 mmol/L   CO2 25 22 - 32 mmol/L   Glucose, Bld 94 70 - 99 mg/dL   BUN 6 6 - 20 mg/dL   Creatinine, Ser 0.54 0.44 - 1.00 mg/dL   Calcium 8.6 (L) 8.9 - 10.3 mg/dL   Total Protein 7.3 6.5 - 8.1 g/dL   Albumin 3.4 (L) 3.5 - 5.0 g/dL   AST 22 15 - 41 U/L   ALT 24 0 - 44 U/L   Alkaline Phosphatase 78 38 - 126 U/L   Total Bilirubin 0.3 0.3 - 1.2 mg/dL   GFR calc non Af Amer >60 >60 mL/min   GFR calc Af Amer >60 >60 mL/min   Anion gap 8 5 - 15  CBC  Result Value Ref Range   WBC 6.3 4.0 - 10.5 K/uL   RBC 4.19 3.87 - 5.11 MIL/uL   Hemoglobin 9.6 (L) 12.0 - 15.0 g/dL   HCT 31.4 (L) 36.0 - 46.0 %   MCV 74.9 (L) 80.0 - 100.0 fL   MCH 22.9 (L) 26.0 - 34.0 pg   MCHC 30.6 30.0 - 36.0 g/dL   RDW 16.9 (H) 11.5 - 15.5 %   Platelets 372 150 - 400 K/uL   nRBC 0.0 0.0 - 0.2 %  Urinalysis, Complete w Microscopic  Result Value Ref Range   Color, Urine YELLOW (A) YELLOW   APPearance CLOUDY (A) CLEAR   Specific Gravity, Urine 1.017 1.005 - 1.030   pH 6.0 5.0 - 8.0   Glucose, UA NEGATIVE NEGATIVE mg/dL   Hgb urine dipstick NEGATIVE NEGATIVE   Bilirubin Urine NEGATIVE NEGATIVE   Ketones, ur NEGATIVE NEGATIVE mg/dL   Protein, ur 30 (A) NEGATIVE mg/dL   Nitrite NEGATIVE NEGATIVE   Leukocytes,Ua NEGATIVE NEGATIVE   WBC, UA 0-5 0 - 5 WBC/hpf   Bacteria, UA RARE (A) NONE SEEN   Squamous Epithelial / LPF >50 (H) 0 - 5   Mucus PRESENT   Ammonia  Result Value Ref Range   Ammonia 25 9 - 35 umol/L  Pregnancy, urine POC  Result Value Ref Range   Preg Test, Ur NEGATIVE NEGATIVE   Ct Abdomen Pelvis W Contrast  Result Date: 06/16/2019 CLINICAL DATA:  Upper abdominal pain with nausea vomiting. History of hepatitis  C. sensation of abdominal distension. EXAM: CT ABDOMEN AND PELVIS WITH CONTRAST TECHNIQUE: Multidetector CT imaging of the abdomen and pelvis was performed using the standard protocol following bolus administration of intravenous contrast. CONTRAST:  193mL OMNIPAQUE IOHEXOL 300 MG/ML  SOLN COMPARISON:  None. FINDINGS: Lower chest: Clear lung bases. Hepatobiliary: Liver normal in size and attenuation. Low attenuation lesion noted in the right lobe measuring 1.6 cm. This is nonspecific. Differential diagnosis includes a hemangioma. No other liver masses or lesions. Normal gallbladder. No bile duct dilation. Pancreas: Unremarkable. No pancreatic ductal dilatation or surrounding inflammatory changes. Spleen: Normal in size without focal abnormality. Adrenals/Urinary Tract: Adrenal glands are unremarkable. Kidneys are normal, without renal calculi, focal lesion, or hydronephrosis. Bladder is unremarkable. Stomach/Bowel: Stomach is within normal limits. Appendix appears normal. No evidence of bowel wall thickening, distention, or inflammatory changes. Vascular/Lymphatic: No significant vascular findings are present. No  enlarged abdominal or pelvic lymph nodes. Reproductive: Uterus and bilateral adnexa are unremarkable. Other: No abdominal wall hernia or abnormality. No abdominopelvic ascites. Musculoskeletal: No acute or significant osseous findings. IMPRESSION: 1. No acute findings within the abdomen or pelvis. No findings to account for the patient's symptoms. 2. 1.6 cm lesion in the right liver lobe. This could be further assessed last/characterized with liver MRI with and without contrast or hemangioma protocol CT. 3. No other abnormalities. Electronically Signed   By: Lajean Manes M.D.   On: 06/16/2019 07:36

## 2019-06-16 NOTE — ED Notes (Signed)
Returned from [place]. Steady gait in room. Await results.

## 2019-06-16 NOTE — ED Triage Notes (Signed)
Patient c/o upper abdominal pain, N/V. Patient reports hx of meningioma on liver and Hep C. Patient feels like her abdomen is distended.

## 2019-12-24 ENCOUNTER — Emergency Department
Admission: EM | Admit: 2019-12-24 | Discharge: 2019-12-24 | Disposition: A | Discharge status: Home / Self Care | Payer: Self-pay | Attending: Emergency Medicine | Admitting: Emergency Medicine

## 2019-12-24 ENCOUNTER — Encounter: Payer: Self-pay | Admitting: Emergency Medicine

## 2019-12-24 ENCOUNTER — Other Ambulatory Visit: Payer: Self-pay

## 2019-12-24 DIAGNOSIS — D329 Benign neoplasm of meninges, unspecified: Secondary | ICD-10-CM | POA: Insufficient documentation

## 2019-12-24 DIAGNOSIS — Z91040 Latex allergy status: Secondary | ICD-10-CM | POA: Insufficient documentation

## 2019-12-24 DIAGNOSIS — F17210 Nicotine dependence, cigarettes, uncomplicated: Secondary | ICD-10-CM | POA: Insufficient documentation

## 2019-12-24 DIAGNOSIS — L0291 Cutaneous abscess, unspecified: Secondary | ICD-10-CM

## 2019-12-24 DIAGNOSIS — E876 Hypokalemia: Secondary | ICD-10-CM | POA: Insufficient documentation

## 2019-12-24 DIAGNOSIS — L0201 Cutaneous abscess of face: Secondary | ICD-10-CM | POA: Insufficient documentation

## 2019-12-24 DIAGNOSIS — Z79899 Other long term (current) drug therapy: Secondary | ICD-10-CM | POA: Insufficient documentation

## 2019-12-24 LAB — CBC WITH DIFFERENTIAL/PLATELET
Abs Immature Granulocytes: 0.04 10*3/uL (ref 0.00–0.07)
Basophils Absolute: 0 10*3/uL (ref 0.0–0.1)
Basophils Relative: 0 %
Eosinophils Absolute: 0.1 10*3/uL (ref 0.0–0.5)
Eosinophils Relative: 1 %
HCT: 32.8 % — ABNORMAL LOW (ref 36.0–46.0)
Hemoglobin: 10.6 g/dL — ABNORMAL LOW (ref 12.0–15.0)
Immature Granulocytes: 0 %
Lymphocytes Relative: 25 %
Lymphs Abs: 2.5 10*3/uL (ref 0.7–4.0)
MCH: 24 pg — ABNORMAL LOW (ref 26.0–34.0)
MCHC: 32.3 g/dL (ref 30.0–36.0)
MCV: 74.4 fL — ABNORMAL LOW (ref 80.0–100.0)
Monocytes Absolute: 0.6 10*3/uL (ref 0.1–1.0)
Monocytes Relative: 6 %
Neutro Abs: 6.8 10*3/uL (ref 1.7–7.7)
Neutrophils Relative %: 68 %
Platelets: 464 10*3/uL — ABNORMAL HIGH (ref 150–400)
RBC: 4.41 MIL/uL (ref 3.87–5.11)
RDW: 19.1 % — ABNORMAL HIGH (ref 11.5–15.5)
WBC: 10 10*3/uL (ref 4.0–10.5)
nRBC: 0 % (ref 0.0–0.2)

## 2019-12-24 LAB — BASIC METABOLIC PANEL
Anion gap: 11 (ref 5–15)
BUN: 6 mg/dL (ref 6–20)
CO2: 22 mmol/L (ref 22–32)
Calcium: 8.7 mg/dL — ABNORMAL LOW (ref 8.9–10.3)
Chloride: 105 mmol/L (ref 98–111)
Creatinine, Ser: 0.5 mg/dL (ref 0.44–1.00)
GFR calc Af Amer: >60 mL/min (ref >60)
GFR calc non Af Amer: >60 mL/min (ref >60)
Glucose, Bld: 123 mg/dL — ABNORMAL HIGH (ref 70–99)
Potassium: 2.7 mmol/L — CL (ref 3.5–5.1)
Sodium: 138 mmol/L (ref 135–145)

## 2019-12-24 MED ORDER — ONDANSETRON 4 MG PO TBDP
4.0000 mg | ORAL_TABLET | Freq: Once | ORAL | Status: AC
  Administered 2019-12-24: 03:00:00 4 mg via ORAL
  Filled 2019-12-24: qty 1

## 2019-12-24 MED ORDER — IBUPROFEN 600 MG PO TABS
600.0000 mg | ORAL_TABLET | Freq: Once | ORAL | Status: AC
  Administered 2019-12-24: 03:00:00 600 mg via ORAL
  Filled 2019-12-24: qty 1

## 2019-12-24 MED ORDER — POTASSIUM CHLORIDE CRYS ER 20 MEQ PO TBCR: 40.0000 meq | EXTENDED_RELEASE_TABLET | Freq: Every day | ORAL | 0 refills | Status: DC

## 2019-12-24 MED ORDER — ONDANSETRON 4 MG PO TBDP: 4.0000 mg | ORAL_TABLET | Freq: Three times a day (TID) | ORAL | 0 refills | Status: DC | PRN

## 2019-12-24 MED ORDER — CLINDAMYCIN HCL 150 MG PO CAPS
300.0000 mg | ORAL_CAPSULE | Freq: Once | ORAL | Status: AC
  Administered 2019-12-24: 03:00:00 300 mg via ORAL
  Filled 2019-12-24: qty 2

## 2019-12-24 MED ORDER — IBUPROFEN 800 MG PO TABS: 800.0000 mg | ORAL_TABLET | Freq: Three times a day (TID) | ORAL | 0 refills | Status: DC | PRN

## 2019-12-24 MED ORDER — LIDOCAINE-PRILOCAINE 2.5-2.5 % EX CREA
TOPICAL_CREAM | CUTANEOUS | Status: AC
  Filled 2019-12-24: qty 5

## 2019-12-24 MED ORDER — CLINDAMYCIN HCL 300 MG PO CAPS: 300.0000 mg | ORAL_CAPSULE | Freq: Three times a day (TID) | ORAL | 0 refills | Status: AC

## 2019-12-24 MED ORDER — POTASSIUM CHLORIDE CRYS ER 20 MEQ PO TBCR
40.0000 meq | EXTENDED_RELEASE_TABLET | Freq: Once | ORAL | Status: AC
  Administered 2019-12-24: 40 meq via ORAL
  Filled 2019-12-24: qty 2

## 2019-12-24 NOTE — ED Provider Notes (Signed)
[facility] Emergency Department Provider Note  ____________________________________________  Time seen: Approximately 3:01 AM  I have reviewed the triage vital signs and the nursing notes.   HISTORY  Chief Complaint Abscess   HPI Shannon Blankenship is a 34 y.o. female who presents for evaluation of facial abscess.  Patient noticed the abscess 3 days ago.  She thinks she might have been bitten by a bug but she is not sure.  It has been getting progressively worse over the last 3 days.  She has had a lot of drainage of pus from it.  No fever or chills, no nausea or vomiting.   She is complaining of severe constant sharp throbbing pain.  Past Medical History:  Diagnosis Date  . Hepatitis C   . Meningioma ([place])     There are no problems to display for this patient.   Past Surgical History:  Procedure Laterality Date  . c-section    . MOUTH SURGERY    . TUBAL LIGATION      Prior to Admission medications   Medication Sig Start Date End Date Taking? Authorizing Provider  azithromycin (ZITHROMAX Z-PAK) 250 MG tablet Take 2 tablets (500 mg) on  Day 1,  followed by 1 tablet (250 mg) once daily on Days 2 through 5. 01/23/19   Earleen Newport, MD  butalbital-acetaminophen-caffeine (FIORICET) [phone] MG tablet Take 1-2 tablets by mouth every 6 (six) hours as needed. 01/23/19 01/23/20  Earleen Newport, MD  chlorpheniramine-HYDROcodone (TUSSIONEX PENNKINETIC ER) 10-8 MG/5ML SUER Take 5 mLs by mouth 2 (two) times daily. 01/23/19   Earleen Newport, MD  clindamycin (CLEOCIN) 300 MG capsule Take 1 capsule (300 mg total) by mouth 3 (three) times daily for 10 days. 12/24/19 01/03/20  Rudene Re, MD  famotidine (PEPCID) 20 MG tablet Take 1 tablet (20 mg total) by mouth 2 (two) times daily. 06/16/19   Carrie Mew, MD  ibuprofen (ADVIL) 800 MG tablet Take 1 tablet (800 mg total) by mouth every 8 (eight) hours as needed. 12/24/19   Rudene Re, MD    ondansetron (ZOFRAN ODT) 4 MG disintegrating tablet Take 1 tablet (4 mg total) by mouth every 8 (eight) hours as needed. 12/24/19   Rudene Re, MD  potassium chloride SA (KLOR-CON) 20 MEQ tablet Take 2 tablets (40 mEq total) by mouth daily for 7 days. 12/24/19 12/31/19  Rudene Re, MD    Allergies Sulfa antibiotics and Latex  No family history on file.  Social History Social History   Tobacco Use  . Smoking status: Current Every Day Smoker    Packs/day: 1.00    Types: Cigarettes  . Smokeless tobacco: Never Used  Substance Use Topics  . Alcohol use: Never  . Drug use: Yes    Comment: heroin    Review of Systems  Constitutional: Negative for fever. Eyes: Negative for visual changes. ENT: Negative for sore throat. + facial abscess Neck: No neck pain  Cardiovascular: Negative for chest pain. Respiratory: Negative for shortness of breath. Gastrointestinal: Negative for abdominal pain, vomiting or diarrhea. Genitourinary: Negative for dysuria. Musculoskeletal: Negative for back pain. Skin: Negative for rash. Neurological: Negative for headaches, weakness or numbness. Psych: No SI or HI  ____________________________________________   PHYSICAL EXAM:  VITAL SIGNS: ED Triage Vitals  Enc Vitals Group     BP 12/24/19 0054 122/80     Pulse Rate 12/24/19 0054 90     Resp 12/24/19 0054 20     Temp 12/24/19 0054  98 F (36.7 C)     Temp Source 12/24/19 0054 Oral     SpO2 12/24/19 0054 100 %     Weight 12/24/19 0052 135 lb (61.2 kg)     Height 12/24/19 0052 5\' 2"  (1.575 m)     Head Circumference --      Peak Flow --      Pain Score 12/24/19 0051 9     Pain Loc --      Pain Edu? --      Excl. in [place]? --     Constitutional: Alert and oriented. Well appearing and in no apparent distress. HEENT:      Head: Normocephalic and atraumatic.        Face: There is a 4cm abscess just anterior to the L ear with a large opening and copious amount of pus draining. No  significant overlying erythema or warmth       Eyes: Conjunctivae are normal. Sclera is non-icteric.       Mouth/Throat: Mucous membranes are moist.       Neck: Supple with no signs of meningismus. Cardiovascular: Regular rate and rhythm.  Respiratory: Normal respiratory effort.  Musculoskeletal: No edema, cyanosis, or erythema of extremities. Neurologic: Normal speech and language. Face is symmetric. Moving all extremities. No gross focal neurologic deficits are appreciated. Skin: Skin is warm, dry and intact. No rash noted. Psychiatric: Mood and affect are normal. Speech and behavior are normal.  ____________________________________________   LABS (all labs ordered are listed, but only abnormal results are displayed)  Labs Reviewed  CBC WITH DIFFERENTIAL/PLATELET - Abnormal; Notable for the following components:      Result Value   Hemoglobin 10.6 (*)    HCT 32.8 (*)    MCV 74.4 (*)    MCH 24.0 (*)    RDW 19.1 (*)    Platelets 464 (*)    All other components within normal limits  BASIC METABOLIC PANEL - Abnormal; Notable for the following components:   Potassium 2.7 (*)    Glucose, Bld 123 (*)    Calcium 8.7 (*)    All other components within normal limits   ____________________________________________  EKG  none  ____________________________________________  RADIOLOGY  none  ____________________________________________   PROCEDURES  Procedure(s) performed: None Procedures Critical Care performed:  None ____________________________________________   INITIAL IMPRESSION / ASSESSMENT AND PLAN / ED COURSE  34 y.o. female who presents for evaluation of facial abscess.  Abscess is already open and draining copious amount of pus with minimal overlying cellulitis.  No indication for I&D at this time.  Wound was cleaned and a dressing applied.  Patient was started on clindamycin.  Labs were consistent with hypokalemia.  Patient was supplemented and started on oral  potassium.  Recommended close follow-up with PCP for recheck of her labs.  She was also given ibuprofen and Zofran.  Discussed wound care and return precautions for signs of worsening infection.  No signs of sepsis.   I have reviewed patient's previous medical records and PMH.      _____________________________________________ Please note:  Patient was evaluated in Emergency Department today for the symptoms described in the history of present illness. Patient was evaluated in the context of the global COVID-19 pandemic, which necessitated consideration that the patient might be at risk for infection with the SARS-CoV-2 virus that causes COVID-19. Institutional protocols and algorithms that pertain to the evaluation of patients at risk for COVID-19 are in a state of rapid change based on information  released by regulatory bodies including the CDC and federal and state organizations. These policies and algorithms were followed during the patient's care in the ED.  Some ED evaluations and interventions may be delayed as a result of limited staffing during the pandemic.   ____________________________________________   FINAL CLINICAL IMPRESSION(S) / ED DIAGNOSES   Final diagnoses:  Abscess  Hypokalemia      NEW MEDICATIONS STARTED DURING THIS VISIT:  ED Discharge Orders         Ordered    clindamycin (CLEOCIN) 300 MG capsule  3 times daily     12/24/19 0300    ibuprofen (ADVIL) 800 MG tablet  Every 8 hours PRN     12/24/19 0300    ondansetron (ZOFRAN ODT) 4 MG disintegrating tablet  Every 8 hours PRN     12/24/19 0300    potassium chloride SA (KLOR-CON) 20 MEQ tablet  Daily     12/24/19 0300           Note:  This document was prepared using Dragon voice recognition software and may include unintentional dictation errors.    Alfred Levins, [place], MD 12/24/19 [phone]

## 2019-12-24 NOTE — ED Triage Notes (Signed)
Pt arrives ambulatory to triage with c/o left facial abscess which she states started x 3 days ago. Pt has pussy and red abscess on the left side of face.

## 2019-12-24 NOTE — ED Notes (Signed)
Site redressed by Dr. Alfred Levins at this time before DC and provided education on site treatment and care. Pt verbalized understanding

## 2019-12-24 NOTE — Discharge Instructions (Addendum)
Take the antibiotics fully as prescribed.  If you notice that the abscess getting bigger, if you have a fever, the redness in your face is spreading please return to the emergency room.  Make sure to do several daily warm compresses to help it drain.  Follow-up with your doctor.  Your potassium was low.  Take once daily potassium supplement as prescribed.  You may take that with Zofran for nausea.  Make sure to have your labs rechecked in a week.

## 2020-01-03 IMAGING — CT CT ABD-PELV W/ CM
2 of 7 series · 15 of 46 positions shown, 17 images · IV contrast (APPLIED)
Comparison: None.

CLINICAL DATA: Upper abdominal pain with nausea vomiting. History
of hepatitis C. sensation of abdominal distension.

EXAM:
CT ABDOMEN AND PELVIS WITH CONTRAST
TECHNIQUE: Multidetector CT imaging of the abdomen and pelvis was performed
using the standard protocol following bolus administration of
intravenous contrast.
CONTRAST:  100mL OMNIPAQUE IOHEXOL 300 MG/ML  SOLN

[Series 2: routine abd/pel with · axial · 0.76mm/px · z∈[-713,-293]mm · 12 of 94 slices shown, 14 images]
[im 5/94  soft-tissue]
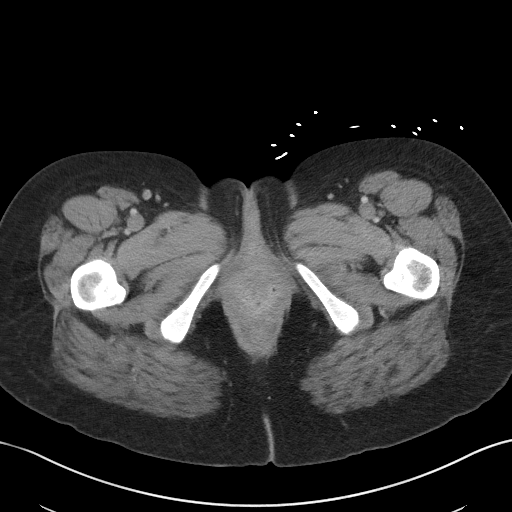
[im 5/94  bone]
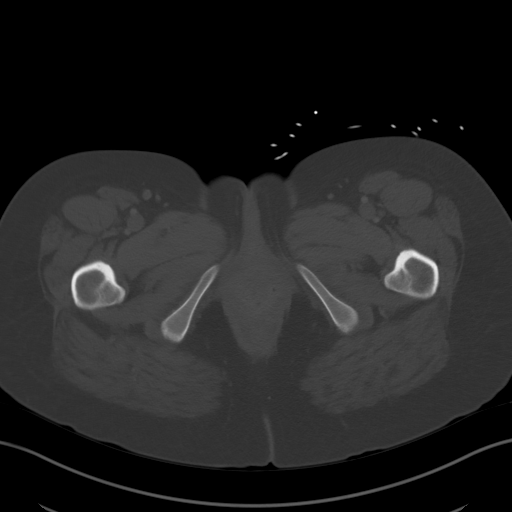
[im 14/94  soft-tissue]
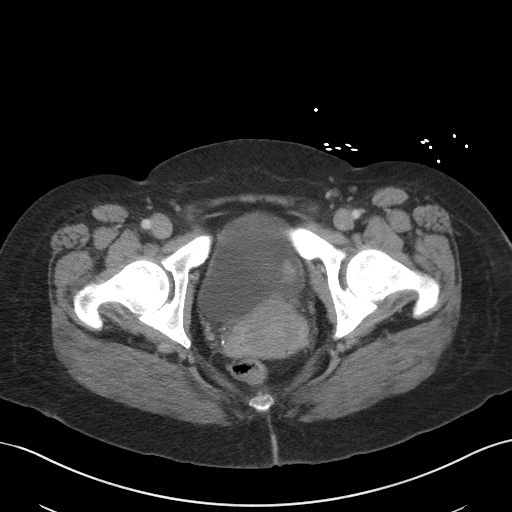
[im 19/94  soft-tissue]
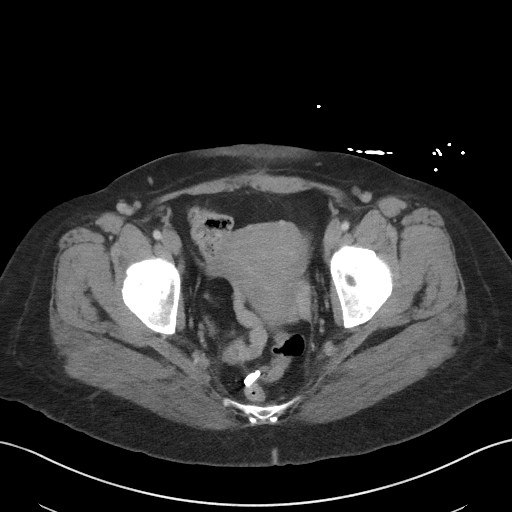
[im 28/94  soft-tissue]
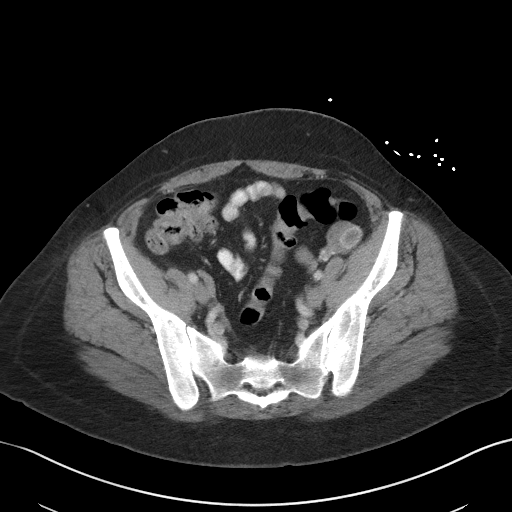
[im 38/94  soft-tissue]
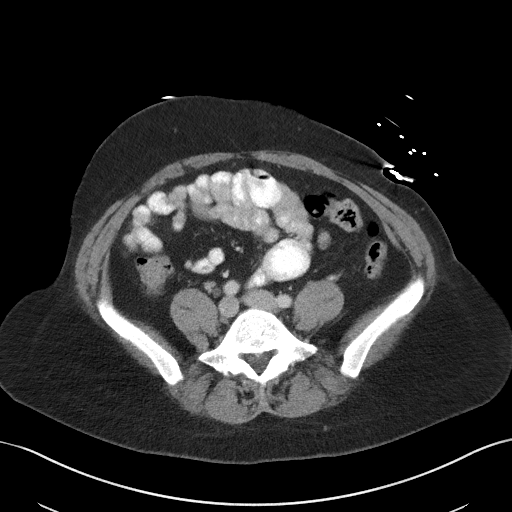
[im 42/94  soft-tissue]
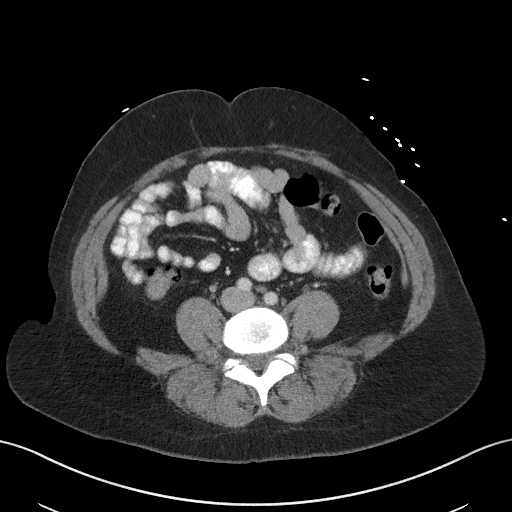
[im 52/94  soft-tissue]
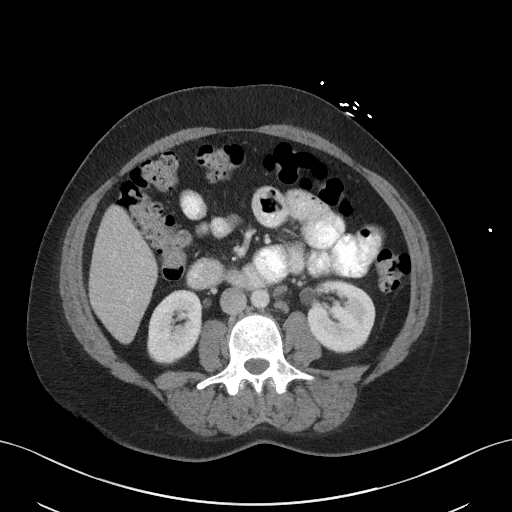
[im 56/94  soft-tissue]
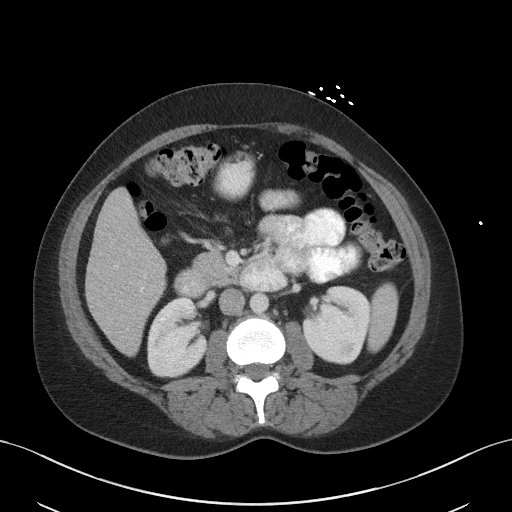
[im 66/94  soft-tissue]
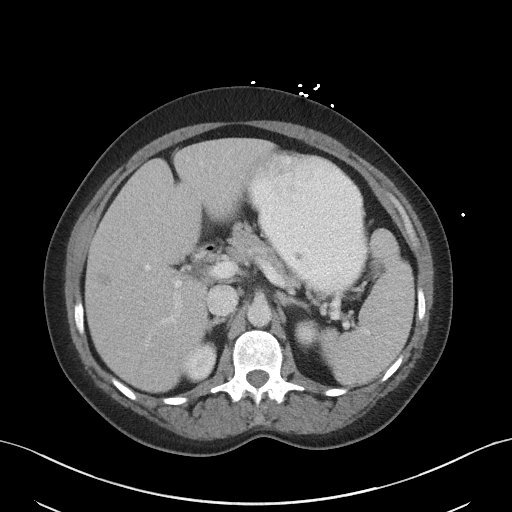
[im 66/94  bone]
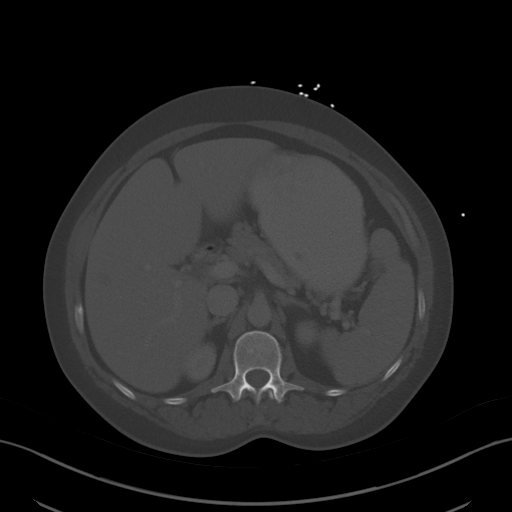
[im 75/94  soft-tissue]
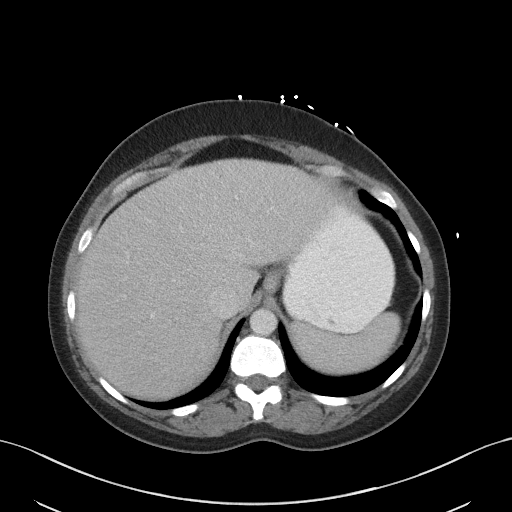
[im 80/94  soft-tissue]
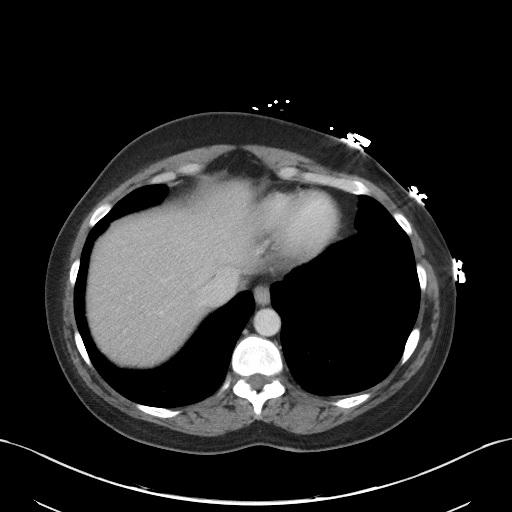
[im 89/94  soft-tissue]
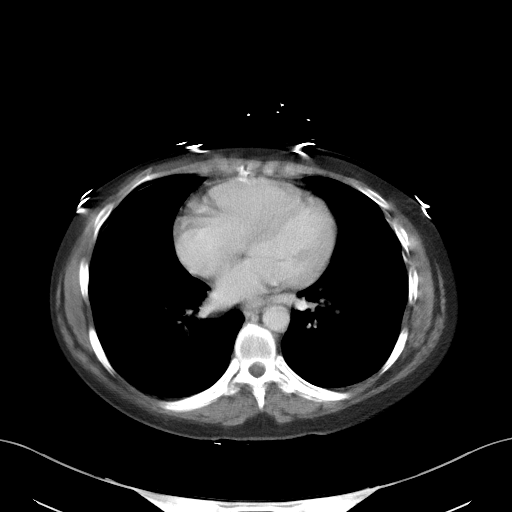

[Series 7: coronal st · coronal · 0.66mm/px · 3 of 78 slices shown]
[im 20/78  soft-tissue]
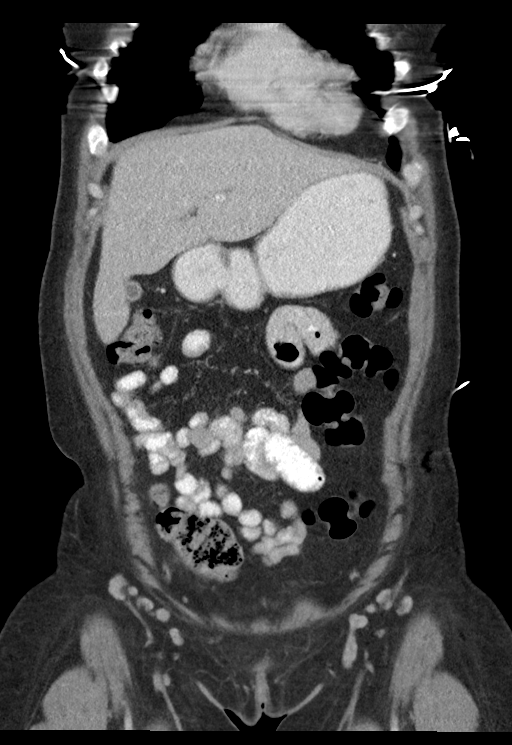
[im 39/78  soft-tissue]
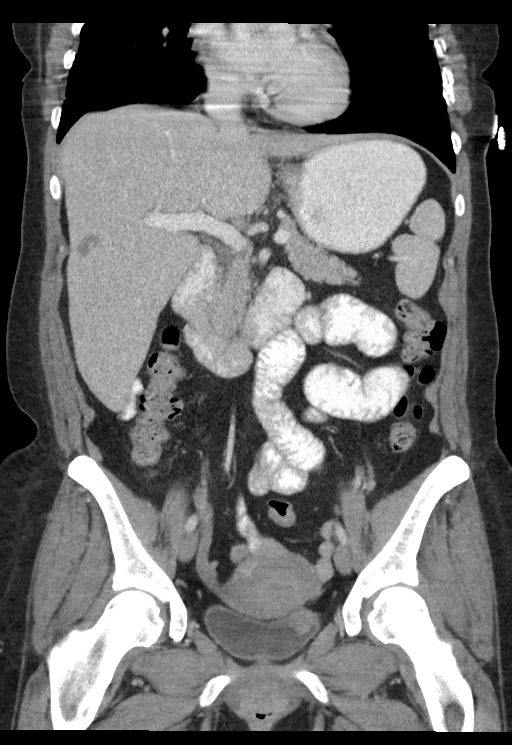
[im 58/78  soft-tissue]
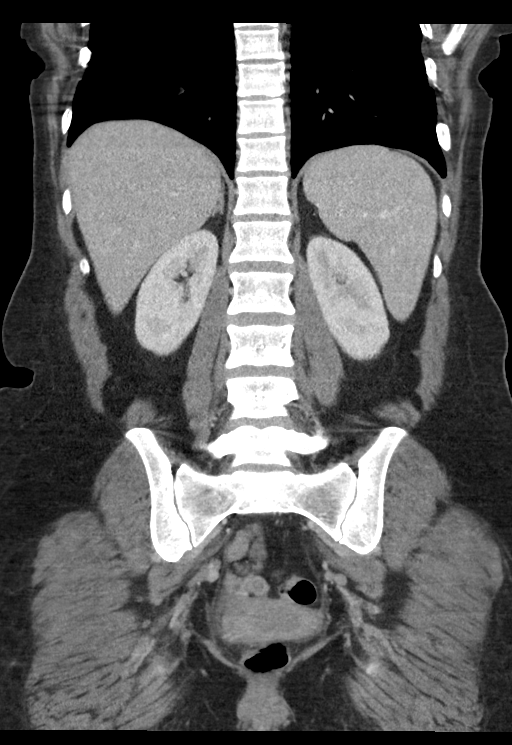

[15 of 46 positions shown; findings below may reference images not displayed]

FINDINGS: Lower chest: Clear lung bases.

Hepatobiliary: Liver normal in size and attenuation. Low attenuation
lesion noted in the right lobe measuring 1.6 cm. This is
nonspecific. Differential diagnosis includes a hemangioma. No other
liver masses or lesions. Normal gallbladder. No bile duct dilation.

Pancreas: Unremarkable. No pancreatic ductal dilatation or
surrounding inflammatory changes.

Spleen: Normal in size without focal abnormality.

Adrenals/Urinary Tract: Adrenal glands are unremarkable. Kidneys are
normal, without renal calculi, focal lesion, or hydronephrosis.
Bladder is unremarkable.

Stomach/Bowel: Stomach is within normal limits. Appendix appears
normal. No evidence of bowel wall thickening, distention, or
inflammatory changes.

Vascular/Lymphatic: No significant vascular findings are present. No
enlarged abdominal or pelvic lymph nodes.

Reproductive: Uterus and bilateral adnexa are unremarkable.

Other: No abdominal wall hernia or abnormality. No abdominopelvic
ascites.

Musculoskeletal: No acute or significant osseous findings.
IMPRESSION: 1. No acute findings within the abdomen or pelvis. No findings to
account for the patient's symptoms.
2. 1.6 cm lesion in the right liver lobe. This could be further
assessed last/characterized with liver MRI with and without contrast
or hemangioma protocol CT.
3. No other abnormalities.

## 2020-01-14 ENCOUNTER — Emergency Department: Admission: EM | Admit: 2020-01-14 | Discharge: 2020-01-14 | Discharge status: Against Medical Advice | Payer: Self-pay

## 2020-01-14 ENCOUNTER — Other Ambulatory Visit: Payer: Self-pay

## 2020-01-14 ENCOUNTER — Encounter: Payer: Self-pay | Admitting: Emergency Medicine

## 2020-01-14 NOTE — ED Notes (Signed)
Patient to desk and states she has a ride here who is going to take her to [facility] as she is already a patient there. Patient ambulatory in lobby with steady gait and NAD noted. Alert and oriented.

## 2020-01-14 NOTE — ED Triage Notes (Signed)
Patient to ED via [facility]. Per EMS, patient was found in bathroom of [employer] [facility] unresponsive. Reports using heroin. EMS reports patient was alert and oriented upon their arrival. Patient denies SI at this time. No narcan given by EMS.

## 2020-03-17 ENCOUNTER — Emergency Department
Admission: EM | Admit: 2020-03-17 | Discharge: 2020-03-17 | Discharge status: Against Medical Advice | Payer: Self-pay | Attending: Emergency Medicine | Admitting: Emergency Medicine

## 2020-03-17 ENCOUNTER — Other Ambulatory Visit: Payer: Self-pay

## 2020-03-17 DIAGNOSIS — F17210 Nicotine dependence, cigarettes, uncomplicated: Secondary | ICD-10-CM | POA: Insufficient documentation

## 2020-03-17 DIAGNOSIS — Z79899 Other long term (current) drug therapy: Secondary | ICD-10-CM | POA: Insufficient documentation

## 2020-03-17 DIAGNOSIS — Z91040 Latex allergy status: Secondary | ICD-10-CM | POA: Insufficient documentation

## 2020-03-17 DIAGNOSIS — T40601A Poisoning by unspecified narcotics, accidental (unintentional), initial encounter: Secondary | ICD-10-CM | POA: Insufficient documentation

## 2020-03-17 DIAGNOSIS — Z5320 Procedure and treatment not carried out because of patient's decision for unspecified reasons: Secondary | ICD-10-CM | POA: Insufficient documentation

## 2020-03-17 NOTE — ED Notes (Addendum)
Pt to RN stating "I'm refusing treatment, I'm leaving". Pt refusing d/c vitals, ambulatory to exit, NAD noted, steady gait. D/c AMA Dr Cherylann Banas notified

## 2020-03-17 NOTE — ED Triage Notes (Signed)
Pt to ED via [facility] from a friend's house for chief complaint of drug usage and being found unresponsive at friend's house after taking supposedly 10 mg percocet that she received from a friend at approx 1300. Fire gave 2 mg IN Narcan when found unresponsive with agonal breathing. Hx heroin user.  Pt in NAD, playing on phone, facing away from RN with arms crossed.  RR even and unlabored

## 2020-03-17 NOTE — ED Notes (Signed)
Dr. Siadecki at bedside 

## 2020-03-17 NOTE — ED Provider Notes (Signed)
[facility] Emergency Department Provider Note ____________________________________________   First MD Initiated Contact with Patient 03/17/20 1505     (approximate)  I have reviewed the triage vital signs and the nursing notes.   HISTORY  Chief Complaint drug usage    HPI Shannon Blankenship is a 34 y.o. female with PMH as noted below as well as a history of substance abuse who presents with apparent drug overdose.  The patient states that she uses heroin but has been off of it for 2 months.  She had some back pain today and took what she believed was a Percocet that she got from a friend.  She then apparently became unresponsive and was found by EMS with agonal breathing.  EMS gave 2 mg of Narcan and the patient awoke and returned to baseline.  She reports chronic left leg sciatica type pain but denies any acute symptoms at this time.  Past Medical History:  Diagnosis Date  . Hepatitis C   . Meningioma ([place])     There are no problems to display for this patient.   Past Surgical History:  Procedure Laterality Date  . c-section    . MOUTH SURGERY    . TUBAL LIGATION      Prior to Admission medications   Medication Sig Start Date End Date Taking? Authorizing Provider  azithromycin (ZITHROMAX Z-PAK) 250 MG tablet Take 2 tablets (500 mg) on  Day 1,  followed by 1 tablet (250 mg) once daily on Days 2 through 5. 01/23/19   Earleen Newport, MD  chlorpheniramine-HYDROcodone (TUSSIONEX PENNKINETIC ER) 10-8 MG/5ML SUER Take 5 mLs by mouth 2 (two) times daily. 01/23/19   Earleen Newport, MD  famotidine (PEPCID) 20 MG tablet Take 1 tablet (20 mg total) by mouth 2 (two) times daily. 06/16/19   Carrie Mew, MD  ibuprofen (ADVIL) 800 MG tablet Take 1 tablet (800 mg total) by mouth every 8 (eight) hours as needed. 12/24/19   Rudene Re, MD  ondansetron (ZOFRAN ODT) 4 MG disintegrating tablet Take 1 tablet (4 mg total) by mouth every 8 (eight) hours  as needed. 12/24/19   Rudene Re, MD  potassium chloride SA (KLOR-CON) 20 MEQ tablet Take 2 tablets (40 mEq total) by mouth daily for 7 days. 12/24/19 12/31/19  Rudene Re, MD    Allergies Sulfa antibiotics and Latex  No family history on file.  Social History Social History   Tobacco Use  . Smoking status: Current Every Day Smoker    Packs/day: 1.00    Types: Cigarettes  . Smokeless tobacco: Never Used  Vaping Use  . Vaping Use: Never used  Substance Use Topics  . Alcohol use: Never  . Drug use: Yes    Comment: heroin    Review of Systems  Constitutional: No fever/chills Eyes: No visual changes. ENT: No sore throat. Cardiovascular: Denies chest pain. Respiratory: Denies shortness of breath. Gastrointestinal: No vomiting or diarrhea.  Genitourinary: Negative for dysuria.  Musculoskeletal: Negative for back pain.  Positive for chronic leg pain. Skin: Negative for rash. Neurological: Negative for headaches, focal weakness or numbness.   ____________________________________________   PHYSICAL EXAM:  VITAL SIGNS: ED Triage Vitals  Enc Vitals Group     BP 03/17/20 1512 139/90     Pulse Rate 03/17/20 1512 75     Resp 03/17/20 1512 18     Temp 03/17/20 1512 98.9 F (37.2 C)     Temp Source 03/17/20 1512 Oral  SpO2 03/17/20 1512 97 %     Weight 03/17/20 1506 141 lb (64 kg)     Height 03/17/20 1506 5\' 2"  (1.575 m)     Head Circumference --      Peak Flow --      Pain Score 03/17/20 1506 0     Pain Loc --      Pain Edu? --      Excl. in [place]? --     Constitutional: Alert and oriented. Well appearing and in no acute distress. Eyes: Conjunctivae are normal.  EOMI. Head: Atraumatic. Nose: No congestion/rhinnorhea. Mouth/Throat: Mucous membranes are moist.   Neck: Normal range of motion.  Cardiovascular: Normal rate, regular rhythm.   Good peripheral circulation. Respiratory: Normal respiratory effort.  No retractions.  Gastrointestinal: No  distention.  Musculoskeletal: No lower extremity edema.  Extremities warm and well perfused.  Neurologic:  Normal speech and language.  Motor intact in all extremities.  Normal coordination.  No gross focal neurologic deficits are appreciated.  Skin:  Skin is warm and dry. No rash noted. Psychiatric: Mood and affect are normal. Speech and behavior are normal.  ____________________________________________   LABS (all labs ordered are listed, but only abnormal results are displayed)  Labs Reviewed - No data to display ____________________________________________  EKG   ____________________________________________  RADIOLOGY    ____________________________________________   PROCEDURES  Procedure(s) performed: No  Procedures  Critical Care performed: No ____________________________________________   INITIAL IMPRESSION / ASSESSMENT AND PLAN / ED COURSE  Pertinent labs & imaging results that were available during my care of the patient were reviewed by me and considered in my medical decision making (see chart for details).  34 year old female with a history of opiate abuse presents after an episode of unresponsiveness which occurred when she took what she believed was a Percocet that she obtained from a friend.  She was found unresponsive by EMS, was given 2 mg of Narcan, and then returned to baseline.  She reports chronic left leg pain but denies any acute complaints at this time.  On exam the patient is well-appearing.  Her vital signs are normal.  Neurologic exam is nonfocal.  There is no evidence of trauma.  She denies any SI or HI.  Presentation is consistent with accidental opiate overdose.  We will observe the patient for a couple of hours to make sure she does not have recurrent loss of consciousness or respiratory depression once the Narcan wears off.  I anticipate discharge home if there are no recurrent symptoms.  ----------------------------------------- 3:44 PM  on 03/17/2020 -----------------------------------------  While initially seeing the patient I had informed her of my recommended plan of observing her for a few hours to make sure that once the Narcan wore off that she did not have recurrent respiratory depression related to whatever drugs she took.  She had agreed with this plan.  However the RN just informed me that the patient got up from her stretcher and eloped.  She was walking with steady gait.  ____________________________________________   FINAL CLINICAL IMPRESSION(S) / ED DIAGNOSES  Final diagnoses:  Opiate overdose, accidental or unintentional, initial encounter ([place])      NEW MEDICATIONS STARTED DURING THIS VISIT:  Current Discharge Medication List       Note:  This document was prepared using Dragon voice recognition software and may include unintentional dictation errors.    Arta Silence, MD 03/17/20 1546

## 2020-03-17 NOTE — ED Notes (Signed)
Pt ambulatory to restroom, NAD noted.  

## 2020-08-07 ENCOUNTER — Other Ambulatory Visit: Payer: Self-pay

## 2020-08-07 ENCOUNTER — Emergency Department: Payer: Self-pay

## 2020-08-07 ENCOUNTER — Encounter: Payer: Self-pay | Admitting: Emergency Medicine

## 2020-08-07 ENCOUNTER — Emergency Department
Admission: EM | Admit: 2020-08-07 | Discharge: 2020-08-07 | Disposition: A | Discharge status: Home / Self Care | Payer: Self-pay | Attending: Emergency Medicine | Admitting: Emergency Medicine

## 2020-08-07 DIAGNOSIS — F17210 Nicotine dependence, cigarettes, uncomplicated: Secondary | ICD-10-CM | POA: Insufficient documentation

## 2020-08-07 DIAGNOSIS — Z20822 Contact with and (suspected) exposure to covid-19: Secondary | ICD-10-CM | POA: Insufficient documentation

## 2020-08-07 DIAGNOSIS — J069 Acute upper respiratory infection, unspecified: Secondary | ICD-10-CM | POA: Insufficient documentation

## 2020-08-07 LAB — RESPIRATORY PANEL BY RT PCR (FLU A&B, COVID)
Influenza A by PCR: NEGATIVE
Influenza B by PCR: NEGATIVE
SARS Coronavirus 2 by RT PCR: NEGATIVE

## 2020-08-07 MED ORDER — AZITHROMYCIN 250 MG PO TABS: ORAL_TABLET | ORAL | 0 refills | Status: DC

## 2020-08-07 MED ORDER — BENZONATATE 100 MG PO CAPS: 100.0000 mg | ORAL_CAPSULE | Freq: Three times a day (TID) | ORAL | 0 refills | Status: DC | PRN

## 2020-08-07 NOTE — ED Provider Notes (Signed)
[facility] Emergency Department Provider Note  ____________________________________________  Time seen: Approximately 10:56 AM  I have reviewed the triage vital signs and the nursing notes.   HISTORY  Chief Complaint Cough, Sore Throat, and Nasal Congestion    HPI Shannon Blankenship is a 34 y.o. female that presents to the emergency department for evaluation of nasal congestion, throat irritation, productive cough with yellow sputum for 2 weeks.  Patient denies any sick contacts.  She states that she usually gets sick around this time of year and requires cough medication from the doctor.  Patient smokes cigarettes.  No fevers, no shortness of breath, chest pain, vomiting, diarrhea.   Past Medical History:  Diagnosis Date  . Hepatitis C   . Meningioma ([place])     There are no problems to display for this patient.   Past Surgical History:  Procedure Laterality Date  . c-section    . MOUTH SURGERY    . TUBAL LIGATION      Prior to Admission medications   Medication Sig Start Date End Date Taking? Authorizing Provider  azithromycin (ZITHROMAX Z-PAK) 250 MG tablet Take 2 tablets (500 mg) on  Day 1,  followed by 1 tablet (250 mg) once daily on Days 2 through 5. 08/07/20   Laban Emperor, PA-C  benzonatate (TESSALON PERLES) 100 MG capsule Take 1 capsule (100 mg total) by mouth 3 (three) times daily as needed. 08/07/20 08/07/21  Laban Emperor, PA-C  chlorpheniramine-HYDROcodone (TUSSIONEX PENNKINETIC ER) 10-8 MG/5ML SUER Take 5 mLs by mouth 2 (two) times daily. 01/23/19   Earleen Newport, MD  famotidine (PEPCID) 20 MG tablet Take 1 tablet (20 mg total) by mouth 2 (two) times daily. 06/16/19   Carrie Mew, MD  ibuprofen (ADVIL) 800 MG tablet Take 1 tablet (800 mg total) by mouth every 8 (eight) hours as needed. 12/24/19   Rudene Re, MD  ondansetron (ZOFRAN ODT) 4 MG disintegrating tablet Take 1 tablet (4 mg total) by mouth every 8 (eight) hours  as needed. 12/24/19   Rudene Re, MD  potassium chloride SA (KLOR-CON) 20 MEQ tablet Take 2 tablets (40 mEq total) by mouth daily for 7 days. 12/24/19 12/31/19  Rudene Re, MD    Allergies Sulfa antibiotics and Latex  No family history on file.  Social History Social History   Tobacco Use  . Smoking status: Current Every Day Smoker    Packs/day: 1.00    Types: Cigarettes  . Smokeless tobacco: Never Used  Vaping Use  . Vaping Use: Never used  Substance Use Topics  . Alcohol use: Never  . Drug use: Yes    Comment: heroin     Review of Systems  Constitutional: No fever/chills Eyes: No visual changes. No discharge. ENT: Positive for congestion and rhinorrhea. Cardiovascular: No chest pain. Respiratory: Positive for cough. No SOB. Gastrointestinal: No abdominal pain.  No nausea, no vomiting.  No diarrhea.  No constipation. Musculoskeletal: Negative for musculoskeletal pain. Skin: Negative for rash, abrasions, lacerations, ecchymosis. Neurological: Negative for headaches.   ____________________________________________   PHYSICAL EXAM:  VITAL SIGNS: ED Triage Vitals  Enc Vitals Group     BP 08/07/20 1019 (!) 145/92     Pulse Rate 08/07/20 1019 73     Resp 08/07/20 1019 18     Temp 08/07/20 1019 98.5 F (36.9 C)     Temp Source 08/07/20 1019 Oral     SpO2 08/07/20 1019 98 %     Weight 08/07/20 1003 150 lb (  68 kg)     Height 08/07/20 1003 5\' 2"  (1.575 m)     Head Circumference --      Peak Flow --      Pain Score 08/07/20 1003 6     Pain Loc --      Pain Edu? --      Excl. in [place]? --      Constitutional: Alert and oriented. Well appearing and in no acute distress. Eyes: Conjunctivae are normal. PERRL. EOMI. No discharge. Head: Atraumatic. ENT: No frontal and maxillary sinus tenderness.      Ears: Tympanic membranes pearly gray with good landmarks. No discharge.      Nose: Mild congestion/rhinnorhea.      Mouth/Throat: Mucous membranes are moist.  Oropharynx non-erythematous. Tonsils not enlarged. No exudates. Uvula midline. Neck: No stridor.   Hematological/Lymphatic/Immunilogical: No cervical lymphadenopathy. Cardiovascular: Normal rate, regular rhythm.  Good peripheral circulation. Respiratory: Normal respiratory effort without tachypnea or retractions. Lungs CTAB. Good air entry to the bases with no decreased or absent breath sounds. Gastrointestinal: Bowel sounds 4 quadrants. Soft and nontender to palpation. No guarding or rigidity. No palpable masses. No distention. Musculoskeletal: Full range of motion to all extremities. No gross deformities appreciated. Neurologic:  Normal speech and language. No gross focal neurologic deficits are appreciated.  Skin:  Skin is warm, dry and intact. No rash noted. Psychiatric: Mood and affect are normal. Speech and behavior are normal. Patient exhibits appropriate insight and judgement.   ____________________________________________   LABS (all labs ordered are listed, but only abnormal results are displayed)  Labs Reviewed  RESPIRATORY PANEL BY RT PCR (FLU A&B, COVID)   ____________________________________________  EKG   ____________________________________________  RADIOLOGY Robinette Haines, personally viewed and evaluated these images (plain radiographs) as part of my medical decision making, as well as reviewing the written report by the radiologist.  DG Chest 1 View  Result Date: 08/07/2020 CLINICAL DATA:  Cough for 1 week EXAM: CHEST  1 VIEW COMPARISON:  01/23/2019 FINDINGS: The heart size and mediastinal contours are within normal limits. No focal airspace consolidation, pleural effusion, or pneumothorax. The visualized skeletal structures are unremarkable. IMPRESSION: No active disease. Electronically Signed   By: Davina Poke D.O.   On: 08/07/2020 11:14    ____________________________________________    PROCEDURES  Procedure(s) performed:     Procedures    Medications - No data to display   ____________________________________________   INITIAL IMPRESSION / ASSESSMENT AND PLAN / ED COURSE  Pertinent labs & imaging results that were available during my care of the patient were reviewed by me and considered in my medical decision making (see chart for details).  Review of the [place] CSRS was performed in accordance of the [place] prior to dispensing any controlled drugs.     Patient's diagnosis is consistent with URI. Vital signs and exam are reassuring.  Covid and influenza tests are negative.  Chest x-ray negative for acute cardiopulmonary processes.  Patient appears well and is staying well hydrated. Patient feels comfortable going home. Patient will be discharged home with prescriptions for azithromycin and tessalon perles. Patient is to follow up with PCP as needed or otherwise directed. Patient is given ED precautions to return to the ED for any worsening or new symptoms.  SHALISA MCQUADE was evaluated in Emergency Department on 08/07/2020 for the symptoms described in the history of present illness. She was evaluated in the context of the global COVID-19 pandemic, which necessitated consideration that the patient might be at  risk for infection with the SARS-CoV-2 virus that causes COVID-19. Institutional protocols and algorithms that pertain to the evaluation of patients at risk for COVID-19 are in a state of rapid change based on information released by regulatory bodies including the CDC and federal and state organizations. These policies and algorithms were followed during the patient's care in the ED.   ____________________________________________  FINAL CLINICAL IMPRESSION(S) / ED DIAGNOSES  Final diagnoses:  Upper respiratory tract infection, unspecified type      NEW MEDICATIONS STARTED DURING THIS VISIT:  ED Discharge Orders         Ordered    azithromycin (ZITHROMAX Z-PAK) 250 MG tablet        08/07/20  1238    benzonatate (TESSALON PERLES) 100 MG capsule  3 times daily PRN        08/07/20 1238              This chart was dictated using voice recognition software/Dragon. Despite best efforts to proofread, errors can occur which can change the meaning. Any change was purely unintentional.    Laban Emperor, PA-C 08/07/20 1450    Delman Kitten, MD 08/07/20 1620

## 2020-08-07 NOTE — ED Triage Notes (Signed)
Pt reports cough, nasal congestions and some sore throat for over a week.

## 2020-08-07 NOTE — Discharge Instructions (Signed)
Your Covid test is negative.  Your chest x-ray does not show any pneumonia.  You can begin azithromycin today.  You can take Tessalon Perles for cough.

## 2020-08-07 NOTE — ED Notes (Signed)
Pt states her pharmacy address is changed, however, will take it up with [facility] herself. Pt was in a rush to leave the ED so she could get her paycheck.

## 2020-08-19 ENCOUNTER — Other Ambulatory Visit: Payer: Self-pay

## 2020-08-19 ENCOUNTER — Emergency Department
Admission: EM | Admit: 2020-08-19 | Discharge: 2020-08-19 | Disposition: A | Discharge status: Home / Self Care | Payer: Self-pay | Attending: Emergency Medicine | Admitting: Emergency Medicine

## 2020-08-19 DIAGNOSIS — Z5321 Procedure and treatment not carried out due to patient leaving prior to being seen by health care provider: Secondary | ICD-10-CM | POA: Insufficient documentation

## 2020-08-19 DIAGNOSIS — R2240 Localized swelling, mass and lump, unspecified lower limb: Secondary | ICD-10-CM | POA: Insufficient documentation

## 2020-08-19 LAB — CBC WITH DIFFERENTIAL/PLATELET
Abs Immature Granulocytes: 0.11 10*3/uL — ABNORMAL HIGH (ref 0.00–0.07)
Basophils Absolute: 0.1 10*3/uL (ref 0.0–0.1)
Basophils Relative: 1 %
Eosinophils Absolute: 0.1 10*3/uL (ref 0.0–0.5)
Eosinophils Relative: 1 %
HCT: 29.6 % — ABNORMAL LOW (ref 36.0–46.0)
Hemoglobin: 9.1 g/dL — ABNORMAL LOW (ref 12.0–15.0)
Immature Granulocytes: 1 %
Lymphocytes Relative: 19 %
Lymphs Abs: 3.4 10*3/uL (ref 0.7–4.0)
MCH: 21.8 pg — ABNORMAL LOW (ref 26.0–34.0)
MCHC: 30.7 g/dL (ref 30.0–36.0)
MCV: 71 fL — ABNORMAL LOW (ref 80.0–100.0)
Monocytes Absolute: 1 10*3/uL (ref 0.1–1.0)
Monocytes Relative: 5 %
Neutro Abs: 13.6 10*3/uL — ABNORMAL HIGH (ref 1.7–7.7)
Neutrophils Relative %: 73 %
Platelets: 488 10*3/uL — ABNORMAL HIGH (ref 150–400)
RBC: 4.17 MIL/uL (ref 3.87–5.11)
RDW: 20.4 % — ABNORMAL HIGH (ref 11.5–15.5)
WBC: 18.3 10*3/uL — ABNORMAL HIGH (ref 4.0–10.5)
nRBC: 0 % (ref 0.0–0.2)

## 2020-08-19 LAB — LACTIC ACID, PLASMA: Lactic Acid, Venous: 2 mmol/L (ref 0.5–1.9)

## 2020-08-19 LAB — COMPREHENSIVE METABOLIC PANEL
ALT: 19 U/L (ref 0–44)
AST: 26 U/L (ref 15–41)
Albumin: 3.3 g/dL — ABNORMAL LOW (ref 3.5–5.0)
Alkaline Phosphatase: 112 U/L (ref 38–126)
Anion gap: 10 (ref 5–15)
BUN: 13 mg/dL (ref 6–20)
CO2: 21 mmol/L — ABNORMAL LOW (ref 22–32)
Calcium: 8.5 mg/dL — ABNORMAL LOW (ref 8.9–10.3)
Chloride: 105 mmol/L (ref 98–111)
Creatinine, Ser: 0.6 mg/dL (ref 0.44–1.00)
GFR, Estimated: >60 mL/min (ref >60)
Glucose, Bld: 113 mg/dL — ABNORMAL HIGH (ref 70–99)
Potassium: 3.3 mmol/L — ABNORMAL LOW (ref 3.5–5.1)
Sodium: 136 mmol/L (ref 135–145)
Total Bilirubin: 0.2 mg/dL — ABNORMAL LOW (ref 0.3–1.2)
Total Protein: 7.3 g/dL (ref 6.5–8.1)

## 2020-08-19 NOTE — ED Notes (Signed)
No answer when called from lobby and cell phone

## 2020-08-19 NOTE — ED Notes (Addendum)
No answer when called several times from lobby 

## 2020-08-19 NOTE — ED Triage Notes (Signed)
PT to ED via POV with c/o insect bite. States about 2 days ago noticed a small red bump to her bottom but has since gotten bigger. No drainage. approx 3 in red area. Pt reports Pain as a burning sensation down her leg.

## 2020-08-22 ENCOUNTER — Telehealth: Payer: Self-pay | Admitting: Emergency Medicine

## 2020-08-22 NOTE — Telephone Encounter (Signed)
Patient called me back.  Says no fever, but some nausea.  has been taking otc for pain. Says she had a family member push on the abscess and it is draining now, but it is hole the size of a quarter and keeps filling back up with pus.  Says there is swelling around the abscess and it makes her entire hip hurt.  I told her it would be a good idea if she returned to get exam.  Explained that she can become very ill from an infection.  She agrees to return shortly.

## 2020-08-22 NOTE — Telephone Encounter (Signed)
Called patient due to left emergency department before provider exam to inquire about condition and follow up plans. Left message. 

## 2021-02-24 IMAGING — DX DG CHEST 1V
1 series · 1 of 1 positions shown · non-contrast
Comparison: 01/23/2019

CLINICAL DATA: Cough for 1 week

EXAM:
CHEST  1 VIEW

[chest ap]
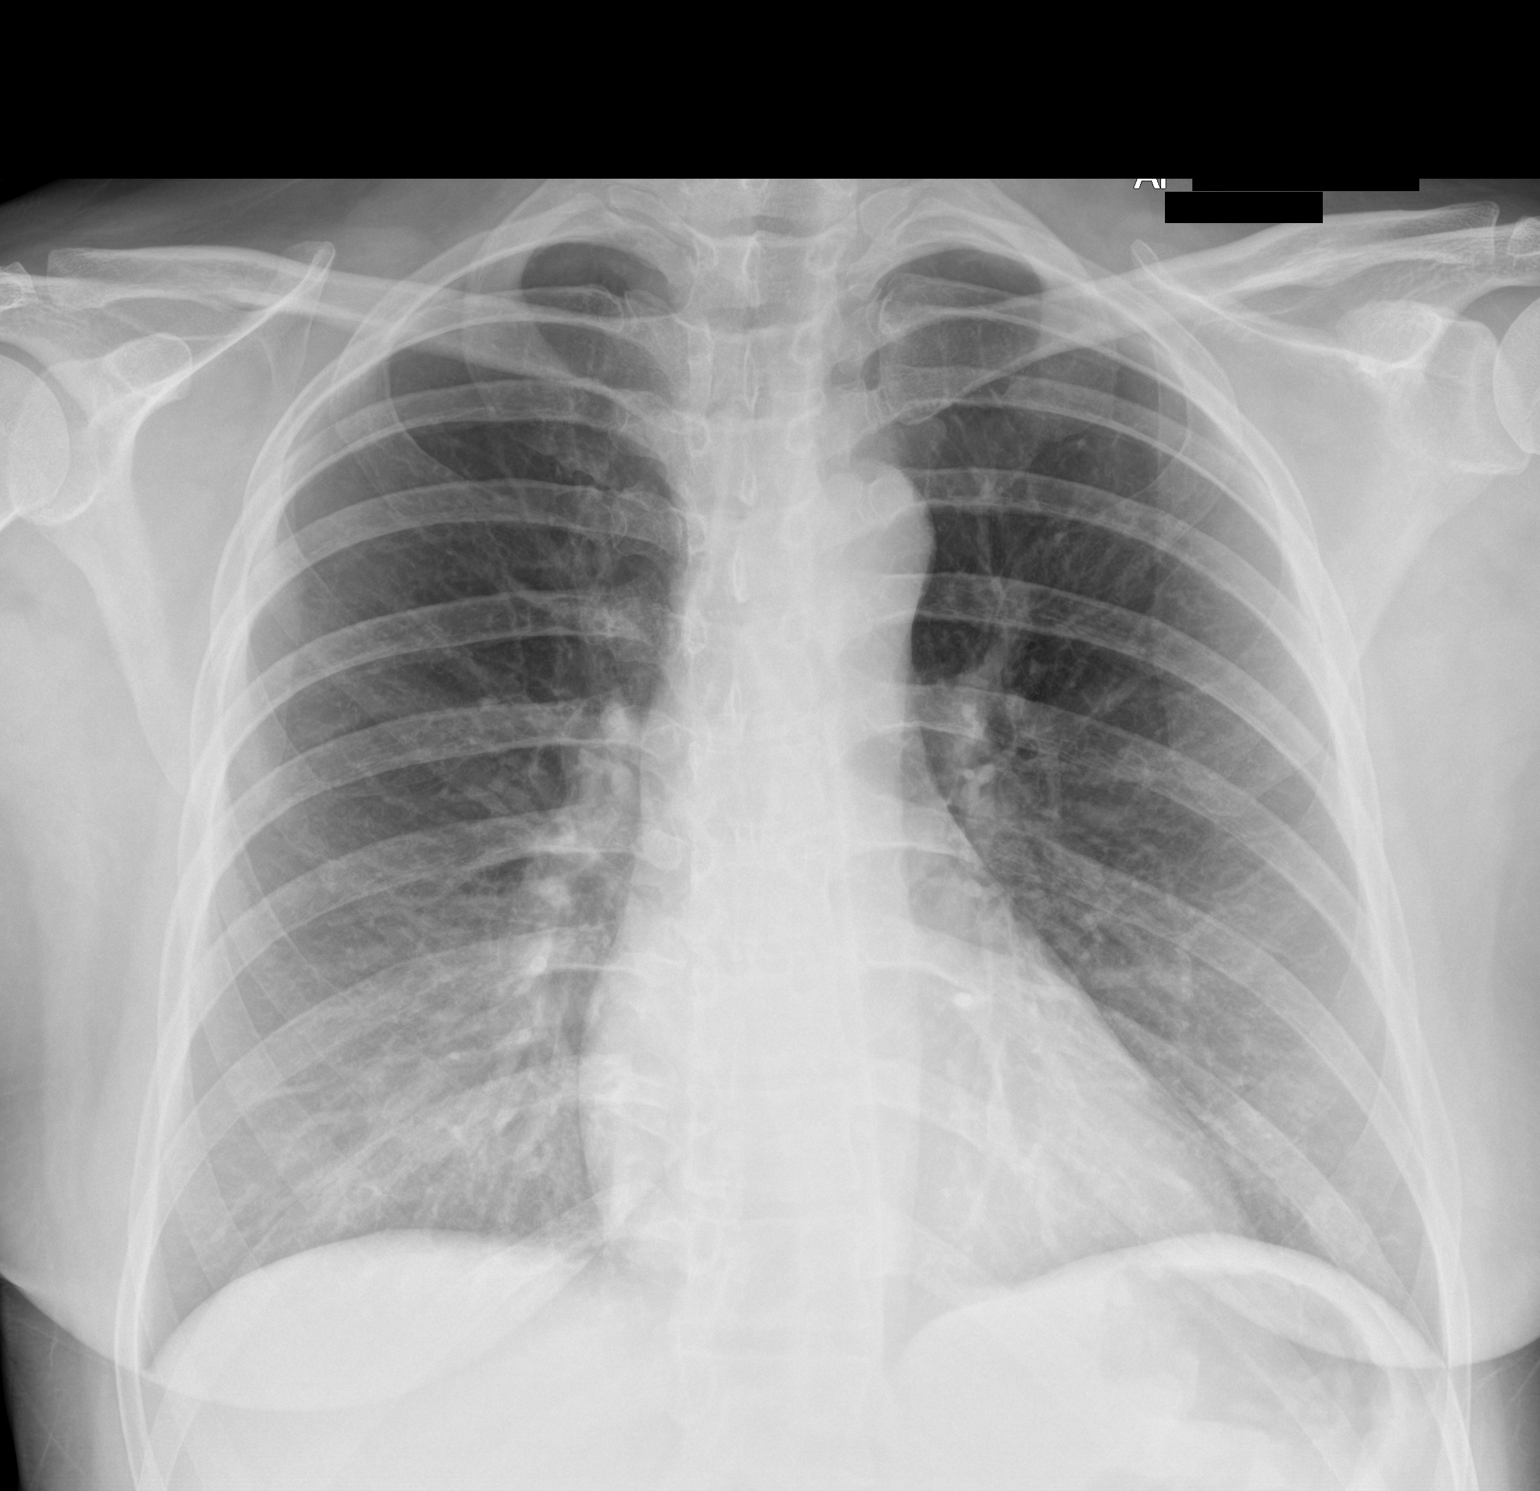

[1 of 1 positions shown; findings below may reference images not displayed]

FINDINGS: The heart size and mediastinal contours are within normal limits. No
focal airspace consolidation, pleural effusion, or pneumothorax. The
visualized skeletal structures are unremarkable.
IMPRESSION: No active disease.

## 2021-05-05 ENCOUNTER — Emergency Department
Admission: EM | Admit: 2021-05-05 | Discharge: 2021-05-05 | Disposition: A | Discharge status: Home / Self Care | Payer: Self-pay | Attending: Emergency Medicine | Admitting: Emergency Medicine

## 2021-05-05 ENCOUNTER — Other Ambulatory Visit: Payer: Self-pay

## 2021-05-05 DIAGNOSIS — Z91040 Latex allergy status: Secondary | ICD-10-CM | POA: Insufficient documentation

## 2021-05-05 DIAGNOSIS — F17210 Nicotine dependence, cigarettes, uncomplicated: Secondary | ICD-10-CM | POA: Insufficient documentation

## 2021-05-05 DIAGNOSIS — L03811 Cellulitis of head [any part, except face]: Secondary | ICD-10-CM | POA: Insufficient documentation

## 2021-05-05 MED ORDER — DOXYCYCLINE HYCLATE 100 MG PO TABS
100.0000 mg | ORAL_TABLET | Freq: Once | ORAL | Status: AC
  Administered 2021-05-05: 100 mg via ORAL
  Filled 2021-05-05: qty 1

## 2021-05-05 MED ORDER — ACETAMINOPHEN 500 MG PO TABS
1000.0000 mg | ORAL_TABLET | Freq: Once | ORAL | Status: AC
  Administered 2021-05-05: 1000 mg via ORAL
  Filled 2021-05-05: qty 2

## 2021-05-05 MED ORDER — DOXYCYCLINE HYCLATE 100 MG PO TABS: 100.0000 mg | ORAL_TABLET | Freq: Two times a day (BID) | ORAL | 0 refills | Status: DC

## 2021-05-05 NOTE — ED Triage Notes (Signed)
Pt to ED for possible insect bite to back of head 4 days ago. States it burns Pt in NAD

## 2021-05-05 NOTE — ED Notes (Signed)
See triage note  Presents s/p insect bite  States noticed an area to back of head about 4 days ago

## 2021-05-05 NOTE — ED Provider Notes (Signed)
[facility] Emergency Department Provider Note  ____________________________________________  Time seen: Approximately 6:17 PM  I have reviewed the triage vital signs and the nursing notes.   HISTORY  Chief Complaint Insect Bite    HPI Shannon Blankenship is a 35 y.o. female who presents the emergency department complaining of a possible insect bite to the left posterior scalp.  Patient states that she was out in the woods couple days ago, may have been bitten by something that she did not feel any specific bite or sting.  She noticed an area that was burning to the back of the scalp that has been enlarging and becoming more painful.  No medications for this prior to arrival.  She states that she has not had a history of recurrent skin infections but has been bitten by spiders multiple times.  Patient denies any fevers or chills though she has felt "warm."       Past Medical History:  Diagnosis Date   Hepatitis C    Meningioma ([place])     There are no problems to display for this patient.   Past Surgical History:  Procedure Laterality Date   c-section     MOUTH SURGERY     TUBAL LIGATION      Prior to Admission medications   Medication Sig Start Date End Date Taking? Authorizing Provider  doxycycline (VIBRA-TABS) 100 MG tablet Take 1 tablet (100 mg total) by mouth 2 (two) times daily. 05/05/21  Yes Rachelann Enloe, Charline Bills, PA-C  ibuprofen (ADVIL) 800 MG tablet Take 1 tablet (800 mg total) by mouth every 8 (eight) hours as needed. 12/24/19   Rudene Re, MD  ondansetron (ZOFRAN ODT) 4 MG disintegrating tablet Take 1 tablet (4 mg total) by mouth every 8 (eight) hours as needed. 12/24/19   Rudene Re, MD  potassium chloride SA (KLOR-CON) 20 MEQ tablet Take 2 tablets (40 mEq total) by mouth daily for 7 days. 12/24/19 12/31/19  Rudene Re, MD    Allergies Sulfa antibiotics and Latex  No family history on file.  Social History Social History    Tobacco Use   Smoking status: Every Day    Packs/day: 1.00    Types: Cigarettes   Smokeless tobacco: Never  Vaping Use   Vaping Use: Never used  Substance Use Topics   Alcohol use: Yes    Comment: "sometimes"    Drug use: Not Currently    Comment: heroin     Review of Systems  Constitutional: No fever/chills Eyes: No visual changes. No discharge ENT: No upper respiratory complaints. Cardiovascular: no chest pain. Respiratory: no cough. No SOB. Gastrointestinal: No abdominal pain.  No nausea, no vomiting.  No diarrhea.  No constipation. Musculoskeletal: Negative for musculoskeletal pain. Skin: Possible insect bite versus infection to the left posterior scalp Neurological: Negative for headaches, focal weakness or numbness.  10 System ROS otherwise negative.  ____________________________________________   PHYSICAL EXAM:  VITAL SIGNS: ED Triage Vitals  Enc Vitals Group     BP 05/05/21 1654 (!) 150/105     Pulse Rate 05/05/21 1654 68     Resp 05/05/21 1654 18     Temp 05/05/21 1654 (!) 97.5 F (36.4 C)     Temp Source 05/05/21 1654 Axillary     SpO2 05/05/21 1654 100 %     Weight --      Height 05/05/21 1654 '5\' 2"'$  (1.575 m)     Head Circumference --      Peak Flow --  Pain Score 05/05/21 1702 9     Pain Loc --      Pain Edu? --      Excl. in [place]? --      Constitutional: Alert and oriented. Well appearing and in no acute distress. Eyes: Conjunctivae are normal. PERRL. EOMI. Head: Atraumatic.  Erythematous and edematous lesion with central area of necrosis noted to the left occipital skull.  This is tender to palpation.  No fluctuance but very indurated. ENT:      Ears:       Nose: No congestion/rhinnorhea.      Mouth/Throat: Mucous membranes are moist.  Neck: No stridor.  Neck is supple [place] motion.  Cardiovascular: Normal rate, regular rhythm. Normal S1 and S2.  Good peripheral circulation. Respiratory: Normal respiratory effort without tachypnea  or retractions. Lungs CTAB. Good air entry to the bases with no decreased or absent breath sounds. Musculoskeletal: Full range of motion to all extremities. No gross deformities appreciated. Neurologic:  Normal speech and language. No gross focal neurologic deficits are appreciated.  Skin:  Skin is warm, dry and intact. No rash noted. Psychiatric: Mood and affect are normal. Speech and behavior are normal. Patient exhibits appropriate insight and judgement.   ____________________________________________   LABS (all labs ordered are listed, but only abnormal results are displayed)  Labs Reviewed - No data to display ____________________________________________  EKG   ____________________________________________  RADIOLOGY   No results found.  ____________________________________________    PROCEDURES  Procedure(s) performed:    Procedures    Medications  acetaminophen (TYLENOL) tablet 1,000 mg (has no administration in time range)  doxycycline (VIBRA-TABS) tablet 100 mg (has no administration in time range)     ____________________________________________   INITIAL IMPRESSION / ASSESSMENT AND PLAN / ED COURSE  Pertinent labs & imaging results that were available during my care of the patient were reviewed by me and considered in my medical decision making (see chart for details).  Review of the [place] CSRS was performed in accordance of the [place] prior to dispensing any controlled drugs.           Patient's diagnosis is consistent with cellulitis of the scalp.  Patient presented to the emergency department with an erythematous and edematous lesion to the left occipital scalp.  Patient had findings consistent with infection in this area.  No evidence of abscess requiring incision and drainage.  Will cover with Doxy to cover for MRSA, cellulitis as well as any tickborne illness.  Return precautions discussed with the patient.  Follow-up primary care as needed..   Patient is given ED precautions to return to the ED for any worsening or new symptoms.     ____________________________________________  FINAL CLINICAL IMPRESSION(S) / ED DIAGNOSES  Final diagnoses:  Cellulitis of head except face      NEW MEDICATIONS STARTED DURING THIS VISIT:  ED Discharge Orders          Ordered    doxycycline (VIBRA-TABS) 100 MG tablet  2 times daily        05/05/21 1915                This chart was dictated using voice recognition software/Dragon. Despite best efforts to proofread, errors can occur which can change the meaning. Any change was purely unintentional.    Darletta Moll, PA-C 05/05/21 1915    Nance Pear, MD 05/05/21 [phone]

## 2021-05-08 ENCOUNTER — Encounter (HOSPITAL_COMMUNITY): Payer: Self-pay | Admitting: *Deleted

## 2021-05-08 ENCOUNTER — Other Ambulatory Visit: Payer: Self-pay

## 2021-05-08 ENCOUNTER — Emergency Department (HOSPITAL_COMMUNITY)
Admission: EM | Admit: 2021-05-08 | Discharge: 2021-05-08 | Disposition: A | Discharge status: Home / Self Care | Payer: Self-pay | Attending: Emergency Medicine | Admitting: Emergency Medicine

## 2021-05-08 DIAGNOSIS — F17210 Nicotine dependence, cigarettes, uncomplicated: Secondary | ICD-10-CM | POA: Insufficient documentation

## 2021-05-08 DIAGNOSIS — L02811 Cutaneous abscess of head [any part, except face]: Secondary | ICD-10-CM | POA: Insufficient documentation

## 2021-05-08 DIAGNOSIS — Z91040 Latex allergy status: Secondary | ICD-10-CM | POA: Insufficient documentation

## 2021-05-08 DIAGNOSIS — L0291 Cutaneous abscess, unspecified: Secondary | ICD-10-CM

## 2021-05-08 NOTE — ED Triage Notes (Signed)
Pt states a tick removed to from scalp.  Pt states abscess started to drain.  Here for evaluation of area.  Pt state taking her antibiotics.

## 2021-05-08 NOTE — ED Provider Notes (Signed)
[facility] EMERGENCY DEPARTMENT Provider Note   CSN: [id number] Arrival date & time: 05/08/21  1037     History Chief Complaint  Patient presents with   Abscess    Shannon Blankenship is a 35 y.o. female with past medical history significant for hepatitis C, meningioma.  Patient presents to emergency department today with chief complaint of abscess to her head x1 day.  Patient states she was seen in outside ED x3 days ago for possible insect bite to left posterior scalp and was prescribed doxycycline to cover for possible tickborne illness versus cellulitis.  Patient states she has been taking the antibiotic.  She states the area has continued to swell.  She was playing with her horse this morning and was accidentally head butted by him  Past Medical History:  Diagnosis Date   Hepatitis C    Meningioma ([place])     There are no problems to display for this patient.   Past Surgical History:  Procedure Laterality Date   c-section     MOUTH SURGERY     TUBAL LIGATION       OB History     Gravida  1   Para      Term      Preterm      AB      Living         SAB      IAB      Ectopic      Multiple      Live Births              History reviewed. No pertinent family history.  Social History   Tobacco Use   Smoking status: Every Day    Packs/day: 1.00    Types: Cigarettes   Smokeless tobacco: Never  Vaping Use   Vaping Use: Never used  Substance Use Topics   Alcohol use: Yes    Comment: "sometimes"    Drug use: Not Currently    Comment: heroin    Home Medications Prior to Admission medications   Medication Sig Start Date End Date Taking? Authorizing Provider  doxycycline (VIBRA-TABS) 100 MG tablet Take 1 tablet (100 mg total) by mouth 2 (two) times daily. 05/05/21   Cuthriell, Charline Bills, PA-C  ibuprofen (ADVIL) 800 MG tablet Take 1 tablet (800 mg total) by mouth every 8 (eight) hours as needed. 12/24/19   Rudene Re, MD  ondansetron  (ZOFRAN ODT) 4 MG disintegrating tablet Take 1 tablet (4 mg total) by mouth every 8 (eight) hours as needed. 12/24/19   Rudene Re, MD  potassium chloride SA (KLOR-CON) 20 MEQ tablet Take 2 tablets (40 mEq total) by mouth daily for 7 days. 12/24/19 12/31/19  Rudene Re, MD    Allergies    Sulfa antibiotics and Latex  Review of Systems   Review of Systems All other systems are reviewed and are negative for acute change except as noted in the HPI.  Physical Exam Updated Vital Signs BP (!) 134/93 (BP Location: Right Arm)   Pulse 99   Temp 98.6 F (37 C) (Oral)   Resp 20   Ht '5\' 2"'$  (1.575 m)   Wt 73 kg   LMP 04/28/2021   SpO2 100%   BMI 29.45 kg/m   Physical Exam Vitals and nursing note reviewed.  Constitutional:      Appearance: She is well-developed. She is not ill-appearing or toxic-appearing.  HENT:     Head: Normocephalic.  Comments: 3 x 4 cm circular area of erythema with purulent drainage on left posterior scalp. Actively draining. Tender to palpation. No bleeding, no red streaking.    Nose: Nose normal.  Eyes:     General: No scleral icterus.       Right eye: No discharge.        Left eye: No discharge.     Extraocular Movements: Extraocular movements intact.     Conjunctiva/sclera: Conjunctivae normal.     Pupils: Pupils are equal, round, and reactive to light.  Neck:     Vascular: No JVD.  Cardiovascular:     Rate and Rhythm: Normal rate and regular rhythm.     Pulses: Normal pulses.     Heart sounds: Normal heart sounds.  Pulmonary:     Effort: Pulmonary effort is normal.     Breath sounds: Normal breath sounds.  Abdominal:     General: There is no distension.  Musculoskeletal:        General: Normal range of motion.     Cervical back: Normal range of motion. No tenderness.  Lymphadenopathy:     Cervical: No cervical adenopathy.  Skin:    General: Skin is warm and dry.  Neurological:     Mental Status: She is oriented to person, place,  and time.     GCS: GCS eye subscore is 4. GCS verbal subscore is 5. GCS motor subscore is 6.     Comments: Fluent speech, no facial droop.  Psychiatric:        Behavior: Behavior normal.    ED Results / Procedures / Treatments   Labs (all labs ordered are listed, but only abnormal results are displayed) Labs Reviewed - No data to display  EKG None  Radiology No results found.  Procedures Procedures   Medications Ordered in ED Medications - No data to display  ED Course  I have reviewed the triage vital signs and the nursing notes.  Pertinent labs & imaging results that were available during my care of the patient were reviewed by me and considered in my medical decision making (see chart for details).    MDM Rules/Calculators/A&P                           History provided by patient with additional history obtained from chart review.    Presenting with abscess on left posterior scalp. Seen in outside ED x 3 days ago and prescribed doxycycline for cellulitis vs possible tick borne illness. Patient afebrile, well appearing here. Exam shows draining abscess. Does not warrant I&D as there is significant drainage. Warm compress applied to wound here.  Discussed plan of care with patient for continued antibiotic use and follow up with pcp for wound check in 2 days.      Portions of this note were generated with [employer]. Dictation errors may occur despite best attempts at proofreading.  Final Clinical Impression(s) / ED Diagnoses Final diagnoses:  Abscess    Rx / DC Orders ED Discharge Orders     None        Lewanda Rife 05/08/21 1336    Milton Ferguson, MD 05/10/21 1028

## 2021-05-08 NOTE — Discharge Instructions (Addendum)
Continue take the antibiotic doxycycline.  You can take ibuprofen for pain at home.  Apply warm compresses at least 4 times daily.

## 2021-07-26 ENCOUNTER — Emergency Department
Admission: EM | Admit: 2021-07-26 | Discharge: 2021-07-26 | Disposition: A | Discharge status: Home / Self Care | Payer: Self-pay | Attending: Student in an Organized Health Care Education/Training Program | Admitting: Student in an Organized Health Care Education/Training Program

## 2021-07-26 ENCOUNTER — Other Ambulatory Visit: Payer: Self-pay

## 2021-07-26 DIAGNOSIS — F17210 Nicotine dependence, cigarettes, uncomplicated: Secondary | ICD-10-CM | POA: Insufficient documentation

## 2021-07-26 DIAGNOSIS — Z91040 Latex allergy status: Secondary | ICD-10-CM | POA: Insufficient documentation

## 2021-07-26 DIAGNOSIS — Z20822 Contact with and (suspected) exposure to covid-19: Secondary | ICD-10-CM | POA: Insufficient documentation

## 2021-07-26 DIAGNOSIS — J111 Influenza due to unidentified influenza virus with other respiratory manifestations: Secondary | ICD-10-CM | POA: Insufficient documentation

## 2021-07-26 LAB — RESP PANEL BY RT-PCR (FLU A&B, COVID) ARPGX2
Influenza A by PCR: NEGATIVE
Influenza B by PCR: NEGATIVE
SARS Coronavirus 2 by RT PCR: NEGATIVE

## 2021-07-26 MED ORDER — OSELTAMIVIR PHOSPHATE 75 MG PO CAPS: 75.0000 mg | ORAL_CAPSULE | Freq: Two times a day (BID) | ORAL | 0 refills | Status: AC

## 2021-07-26 MED ORDER — BENZONATATE 100 MG PO CAPS: 100.0000 mg | ORAL_CAPSULE | Freq: Three times a day (TID) | ORAL | 0 refills | Status: DC | PRN

## 2021-07-26 NOTE — ED Notes (Signed)
Resp panel swab sent to lab.

## 2021-07-26 NOTE — ED Notes (Signed)
This RN reviewed paperwork with pt. No further complaints or questions. Pt ambulated to lobby. Discharge form placed in med rec box.  

## 2021-07-26 NOTE — ED Provider Notes (Signed)
Shannon Blankenship    Event Date/Time   First MD Initiated Contact with Patient 07/26/21 [phone]     (approximate)  I have reviewed the triage vital signs and the nursing notes.   HISTORY  Chief Complaint Sore Throat and Cough    HPI Shannon Blankenship is a 35 y.o. female below listed past medical history presents to the ER for evaluation of cough congestion sore throat body aches headache for the past 2 3 days.  States that she has been spending time with her aunt who just tested positive for influenza also with her nephew who also tested positive for flu and she is having similar symptoms.  Denies any abdominal pain.  Has been taking Tylenol Motrin for for pain.  Past Medical History:  Diagnosis Date   Hepatitis C    Meningioma ([place])    No family history on file. Past Surgical History:  Procedure Laterality Date   c-section     MOUTH SURGERY     TUBAL LIGATION     There are no problems to display for this patient.     Prior to Admission medications   Medication Sig Start Date End Date Taking? Authorizing Provider  benzonatate (TESSALON PERLES) 100 MG capsule Take 1 capsule (100 mg total) by mouth 3 (three) times daily as needed for cough. 07/26/21 07/26/22 Yes Merlyn Lot, MD  oseltamivir (TAMIFLU) 75 MG capsule Take 1 capsule (75 mg total) by mouth 2 (two) times daily for 5 days. 07/26/21 07/31/21 Yes Merlyn Lot, MD  doxycycline (VIBRA-TABS) 100 MG tablet Take 1 tablet (100 mg total) by mouth 2 (two) times daily. 05/05/21   Cuthriell, Charline Bills, PA-C  ibuprofen (ADVIL) 800 MG tablet Take 1 tablet (800 mg total) by mouth every 8 (eight) hours as needed. 12/24/19   Rudene Re, MD  ondansetron (ZOFRAN ODT) 4 MG disintegrating tablet Take 1 tablet (4 mg total) by mouth every 8 (eight) hours as needed. 12/24/19   Rudene Re, MD  potassium chloride SA (KLOR-CON) 20 MEQ tablet Take 2 tablets (40 mEq  total) by mouth daily for 7 days. 12/24/19 12/31/19  Rudene Re, MD    Allergies Sulfa antibiotics and Latex    Social History Social History   Tobacco Use   Smoking status: Every Day    Packs/day: 1.00    Types: Cigarettes   Smokeless tobacco: Never  Vaping Use   Vaping Use: Never used  Substance Use Topics   Alcohol use: Yes    Comment: "sometimes"    Drug use: Not Currently    Comment: heroin    Review of Systems Patient denies headaches, rhinorrhea, blurry vision, numbness, shortness of breath, chest pain, edema, cough, abdominal pain, nausea, vomiting, diarrhea, dysuria, fevers, rashes or hallucinations unless otherwise stated above in HPI. ____________________________________________   PHYSICAL EXAM:  VITAL SIGNS: Vitals:   07/26/21 0819  BP: 131/81  Pulse: 85  Resp: 18  Temp: 97.8 F (36.6 C)  SpO2: 96%    Constitutional: Alert and oriented.  Eyes: Conjunctivae are normal.  Head: Atraumatic. Nose: + congestion/rhinnorhea. Mouth/Throat: Mucous membranes are moist.  Erythematous tonsils bilat, uvula midline, no tirsmus, no exudeates Neck: No stridor. Painless ROM.  Cardiovascular: Normal rate, regular rhythm. Grossly normal heart sounds.  Good peripheral circulation. Respiratory: Normal respiratory effort.  No retractions. Lungs CTA throughout Gastrointestinal: Soft and nontender. No distention. No abdominal bruits. No CVA tenderness. Genitourinary:  Musculoskeletal: No lower extremity tenderness nor edema.  No joint effusions. Neurologic:  Normal speech and language. No gross focal neurologic deficits are appreciated. No facial droop Skin:  Skin is warm, dry and intact. No rash noted. Psychiatric: Mood and affect are normal. Speech and behavior are normal.  ____________________________________________   LABS (all labs ordered are listed, but only abnormal results are displayed)  No results found for this or any previous visit (from the past 24  hour(s)). ____________________________________________ ____________________________________________   PROCEDURES  Procedure(s) performed:  Procedures    Critical Care performed: no ____________________________________________   INITIAL IMPRESSION / ASSESSMENT AND PLAN / ED COURSE  Pertinent labs & imaging results that were available during my care of the patient were reviewed by me and considered in my medical decision making (see chart for details).   DDX: uri, flu, covid, rsv, pna, strep  Shannon Blankenship is a 35 y.o. who presents to the ED with flulike symptoms after confirmed recent exposure to someone with influenza.  Exam and history is consistent with flu.  Patient would like to be treated with Tamiflu.  Do not feel that further diagnostic testing clinically indicated at this time.  She stable and appropriate for outpatient follow-up.  We discussed strict return precautions.     The patient was evaluated in Emergency Department today for the symptoms described in the history of present illness. He/she was evaluated in the context of the global COVID-19 pandemic, which necessitated consideration that the patient might be at risk for infection with the SARS-CoV-2 virus that causes COVID-19. Institutional protocols and algorithms that pertain to the evaluation of patients at risk for COVID-19 are in a state of rapid change based on information released by regulatory bodies including the CDC and federal and state organizations. These policies and algorithms were followed during the patient's care in the ED.  As part of my medical decision making, I reviewed the following data within the [facility] notes reviewed and incorporated, Labs reviewed, notes from prior ED visits and [place] Controlled Substance Database   ____________________________________________   FINAL CLINICAL IMPRESSION(S) / ED DIAGNOSES  Final diagnoses:  Influenza-like illness      NEW  MEDICATIONS STARTED DURING THIS VISIT:  New Prescriptions   BENZONATATE (TESSALON PERLES) 100 MG CAPSULE    Take 1 capsule (100 mg total) by mouth 3 (three) times daily as needed for cough.   OSELTAMIVIR (TAMIFLU) 75 MG CAPSULE    Take 1 capsule (75 mg total) by mouth 2 (two) times daily for 5 days.     Blankenship:  This document was prepared using Dragon voice recognition software and may include unintentional dictation errors.    Merlyn Lot, MD 07/26/21 [phone]

## 2021-07-26 NOTE — ED Triage Notes (Signed)
Pt here with covid symptoms for 2 days. Pt states that she has a cough, sore throat, and sneezing. Pt in NAD in triage.

## 2021-12-14 ENCOUNTER — Emergency Department
Admission: EM | Admit: 2021-12-14 | Discharge: 2021-12-15 | Disposition: A | Discharge status: Home / Self Care | Payer: Self-pay | Attending: Emergency Medicine | Admitting: Emergency Medicine

## 2021-12-14 ENCOUNTER — Other Ambulatory Visit: Payer: Self-pay

## 2021-12-14 DIAGNOSIS — W57XXXA Bitten or stung by nonvenomous insect and other nonvenomous arthropods, initial encounter: Secondary | ICD-10-CM

## 2021-12-14 DIAGNOSIS — L03317 Cellulitis of buttock: Secondary | ICD-10-CM | POA: Insufficient documentation

## 2021-12-14 DIAGNOSIS — L03312 Cellulitis of back [any part except buttock]: Secondary | ICD-10-CM

## 2021-12-14 DIAGNOSIS — Z91040 Latex allergy status: Secondary | ICD-10-CM | POA: Insufficient documentation

## 2021-12-14 DIAGNOSIS — T63391A Toxic effect of venom of other spider, accidental (unintentional), initial encounter: Secondary | ICD-10-CM | POA: Insufficient documentation

## 2021-12-14 DIAGNOSIS — T7421XA Adult sexual abuse, confirmed, initial encounter: Secondary | ICD-10-CM | POA: Insufficient documentation
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
## 2021-12-15 MED ORDER — DOXYCYCLINE HYCLATE 50 MG PO CAPS: 100.0000 mg | ORAL_CAPSULE | Freq: Two times a day (BID) | ORAL | 0 refills | Status: DC

## 2021-12-15 MED ORDER — DOXYCYCLINE HYCLATE 100 MG PO TABS
100.0000 mg | ORAL_TABLET | Freq: Once | ORAL | Status: AC
  Administered 2021-12-15: 100 mg via ORAL
  Filled 2021-12-15: qty 1
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
## 2021-12-15 NOTE — ED Notes (Signed)
This nurse and Dr. Beather Arbour at bedside to assess spider bite to pt back. Pt on phone, providers made request for pt to get off phone during wound assessment and pt refused. Wound noted to right lower back. Sane nurse in department at this time.  ?
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
## 2022-01-19 ENCOUNTER — Other Ambulatory Visit: Payer: Self-pay

## 2022-01-19 ENCOUNTER — Encounter: Payer: Self-pay | Admitting: Emergency Medicine

## 2022-01-19 ENCOUNTER — Emergency Department
Admission: EM | Admit: 2022-01-19 | Discharge: 2022-01-19 | Disposition: A | Discharge status: Home / Self Care | Payer: Self-pay | Attending: Emergency Medicine | Admitting: Emergency Medicine

## 2022-01-19 DIAGNOSIS — T402X1A Poisoning by other opioids, accidental (unintentional), initial encounter: Secondary | ICD-10-CM | POA: Insufficient documentation

## 2022-01-19 DIAGNOSIS — E876 Hypokalemia: Secondary | ICD-10-CM | POA: Insufficient documentation

## 2022-01-19 DIAGNOSIS — T50901A Poisoning by unspecified drugs, medicaments and biological substances, accidental (unintentional), initial encounter: Secondary | ICD-10-CM

## 2022-01-19 DIAGNOSIS — L03312 Cellulitis of back [any part except buttock]: Secondary | ICD-10-CM | POA: Insufficient documentation

## 2022-01-19 DIAGNOSIS — R0902 Hypoxemia: Secondary | ICD-10-CM | POA: Insufficient documentation

## 2022-01-19 LAB — CBC
HCT: 35.6 % — ABNORMAL LOW (ref 36.0–46.0)
Hemoglobin: 11.1 g/dL — ABNORMAL LOW (ref 12.0–15.0)
MCH: 25.8 pg — ABNORMAL LOW (ref 26.0–34.0)
MCHC: 31.2 g/dL (ref 30.0–36.0)
MCV: 82.8 fL (ref 80.0–100.0)
Platelets: 352 10*3/uL (ref 150–400)
RBC: 4.3 MIL/uL (ref 3.87–5.11)
RDW: 15 % (ref 11.5–15.5)
WBC: 11.9 10*3/uL — ABNORMAL HIGH (ref 4.0–10.5)
nRBC: 0 % (ref 0.0–0.2)

## 2022-01-19 LAB — HCG, QUANTITATIVE, PREGNANCY: hCG, Beta Chain, Quant, S: <1 m[IU]/mL (ref <5)

## 2022-01-19 LAB — URINE DRUG SCREEN, QUALITATIVE (ARMC ONLY)
Amphetamines, Ur Screen: NOT DETECTED
Barbiturates, Ur Screen: NOT DETECTED
Benzodiazepine, Ur Scrn: NOT DETECTED
Cannabinoid 50 Ng, Ur ~~LOC~~: NOT DETECTED
Cocaine Metabolite,Ur ~~LOC~~: POSITIVE — AB
MDMA (Ecstasy)Ur Screen: NOT DETECTED
Methadone Scn, Ur: NOT DETECTED
Opiate, Ur Screen: POSITIVE — AB
Phencyclidine (PCP) Ur S: NOT DETECTED
Tricyclic, Ur Screen: NOT DETECTED

## 2022-01-19 LAB — COMPREHENSIVE METABOLIC PANEL
ALT: 20 U/L (ref 0–44)
AST: 21 U/L (ref 15–41)
Albumin: 3.4 g/dL — ABNORMAL LOW (ref 3.5–5.0)
Alkaline Phosphatase: 92 U/L (ref 38–126)
Anion gap: 11 (ref 5–15)
BUN: 12 mg/dL (ref 6–20)
CO2: 22 mmol/L (ref 22–32)
Calcium: 8.2 mg/dL — ABNORMAL LOW (ref 8.9–10.3)
Chloride: 103 mmol/L (ref 98–111)
Creatinine, Ser: 0.66 mg/dL (ref 0.44–1.00)
GFR, Estimated: >60 mL/min (ref >60)
Glucose, Bld: 151 mg/dL — ABNORMAL HIGH (ref 70–99)
Potassium: 3.1 mmol/L — ABNORMAL LOW (ref 3.5–5.1)
Sodium: 136 mmol/L (ref 135–145)
Total Bilirubin: 0.4 mg/dL (ref 0.3–1.2)
Total Protein: 7.1 g/dL (ref 6.5–8.1)

## 2022-01-19 LAB — ETHANOL: Alcohol, Ethyl (B): <10 mg/dL (ref <10)

## 2022-01-19 LAB — MAGNESIUM: Magnesium: 2 mg/dL (ref 1.7–2.4)

## 2022-01-19 MED ORDER — DOXYCYCLINE HYCLATE 50 MG PO CAPS: 100.0000 mg | ORAL_CAPSULE | Freq: Two times a day (BID) | ORAL | 0 refills | Status: AC

## 2022-01-19 MED ORDER — NALOXONE HCL 4 MG/0.1ML NA LIQD: NASAL | 0 refills | Status: AC

## 2022-01-19 MED ORDER — ONDANSETRON HCL 4 MG/2ML IJ SOLN: 4.0000 mg | Freq: Once | INTRAMUSCULAR | Status: DC

## 2022-01-19 MED ORDER — DOXYCYCLINE HYCLATE 100 MG PO TABS
100.0000 mg | ORAL_TABLET | Freq: Once | ORAL | Status: AC
  Administered 2022-01-19: 100 mg via ORAL
  Filled 2022-01-19: qty 1

## 2022-01-19 MED ORDER — POTASSIUM CHLORIDE CRYS ER 20 MEQ PO TBCR
40.0000 meq | EXTENDED_RELEASE_TABLET | Freq: Once | ORAL | Status: AC
  Administered 2022-01-19: 40 meq via ORAL
  Filled 2022-01-19: qty 2

## 2022-01-19 NOTE — ED Triage Notes (Signed)
Pt presents from [facility] via [facility] with c/o drug overdose,. Per ems pt overdosed on cocaine lased with fentanyl. Pt received '4mg'$  narcan total. Pt asleep upon arrival, but will respond to voice and pain. Pt reports "she did not know she was doing cocaine". Pt denies SI/HI, AH/VH. ?
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
## 2022-01-19 NOTE — ED Notes (Signed)
Patient discharged with education on how to use narcan for overdose ?

## 2022-03-28 ENCOUNTER — Encounter: Payer: Self-pay | Admitting: Emergency Medicine

## 2022-03-28 ENCOUNTER — Emergency Department: Payer: Self-pay

## 2022-03-28 ENCOUNTER — Emergency Department
Admission: EM | Admit: 2022-03-28 | Discharge: 2022-03-29 | Disposition: A | Discharge status: Other Acute Inpatient Hospital | Payer: Self-pay | Attending: Emergency Medicine | Admitting: Emergency Medicine

## 2022-03-28 DIAGNOSIS — T39091A Poisoning by salicylates, accidental (unintentional), initial encounter: Secondary | ICD-10-CM | POA: Insufficient documentation

## 2022-03-28 DIAGNOSIS — F1410 Cocaine abuse, uncomplicated: Secondary | ICD-10-CM | POA: Insufficient documentation

## 2022-03-28 DIAGNOSIS — Z20822 Contact with and (suspected) exposure to covid-19: Secondary | ICD-10-CM | POA: Insufficient documentation

## 2022-03-28 DIAGNOSIS — T391X1A Poisoning by 4-Aminophenol derivatives, accidental (unintentional), initial encounter: Secondary | ICD-10-CM | POA: Insufficient documentation

## 2022-03-28 DIAGNOSIS — A419 Sepsis, unspecified organism: Secondary | ICD-10-CM | POA: Insufficient documentation

## 2022-03-28 DIAGNOSIS — K922 Gastrointestinal hemorrhage, unspecified: Secondary | ICD-10-CM | POA: Insufficient documentation

## 2022-03-28 DIAGNOSIS — S02602B Fracture of unspecified part of body of left mandible, initial encounter for open fracture: Secondary | ICD-10-CM | POA: Insufficient documentation

## 2022-03-28 LAB — URINALYSIS, ROUTINE W REFLEX MICROSCOPIC
Bilirubin Urine: NEGATIVE
Glucose, UA: NEGATIVE mg/dL
Ketones, ur: NEGATIVE mg/dL
Nitrite: NEGATIVE
Protein, ur: NEGATIVE mg/dL
Specific Gravity, Urine: 1.017 (ref 1.005–1.030)
pH: 5 (ref 5.0–8.0)

## 2022-03-28 LAB — CBC WITH DIFFERENTIAL/PLATELET
Abs Immature Granulocytes: 0.1 10*3/uL — ABNORMAL HIGH (ref 0.00–0.07)
Basophils Absolute: 0 10*3/uL (ref 0.0–0.1)
Basophils Relative: 0 %
Eosinophils Absolute: 0.1 10*3/uL (ref 0.0–0.5)
Eosinophils Relative: 1 %
HCT: 12.5 % — CL (ref 36.0–46.0)
Hemoglobin: 3.8 g/dL — CL (ref 12.0–15.0)
Immature Granulocytes: 1 %
Lymphocytes Relative: 25 %
Lymphs Abs: 3.2 10*3/uL (ref 0.7–4.0)
MCH: 23.5 pg — ABNORMAL LOW (ref 26.0–34.0)
MCHC: 30.4 g/dL (ref 30.0–36.0)
MCV: 77.2 fL — ABNORMAL LOW (ref 80.0–100.0)
Monocytes Absolute: 0.8 10*3/uL (ref 0.1–1.0)
Monocytes Relative: 6 %
Neutro Abs: 8.6 10*3/uL — ABNORMAL HIGH (ref 1.7–7.7)
Neutrophils Relative %: 67 %
Platelets: 441 10*3/uL — ABNORMAL HIGH (ref 150–400)
RBC: 1.62 MIL/uL — ABNORMAL LOW (ref 3.87–5.11)
RDW: 18.9 % — ABNORMAL HIGH (ref 11.5–15.5)
WBC: 12.7 10*3/uL — ABNORMAL HIGH (ref 4.0–10.5)
nRBC: 0.2 % (ref 0.0–0.2)

## 2022-03-28 LAB — COMPREHENSIVE METABOLIC PANEL
ALT: 188 U/L — ABNORMAL HIGH (ref 0–44)
AST: 43 U/L — ABNORMAL HIGH (ref 15–41)
Albumin: 2.8 g/dL — ABNORMAL LOW (ref 3.5–5.0)
Alkaline Phosphatase: 116 U/L (ref 38–126)
Anion gap: 11 (ref 5–15)
BUN: 15 mg/dL (ref 6–20)
CO2: 20 mmol/L — ABNORMAL LOW (ref 22–32)
Calcium: 8.8 mg/dL — ABNORMAL LOW (ref 8.9–10.3)
Chloride: 105 mmol/L (ref 98–111)
Creatinine, Ser: 0.67 mg/dL (ref 0.44–1.00)
GFR, Estimated: >60 mL/min (ref >60)
Glucose, Bld: 100 mg/dL — ABNORMAL HIGH (ref 70–99)
Potassium: 3.8 mmol/L (ref 3.5–5.1)
Sodium: 136 mmol/L (ref 135–145)
Total Bilirubin: 0.5 mg/dL (ref 0.3–1.2)
Total Protein: 6.5 g/dL (ref 6.5–8.1)

## 2022-03-28 LAB — LACTIC ACID, PLASMA: Lactic Acid, Venous: 2.9 mmol/L (ref 0.5–1.9)

## 2022-03-28 LAB — HCG, QUANTITATIVE, PREGNANCY: hCG, Beta Chain, Quant, S: 1 m[IU]/mL (ref <5)

## 2022-03-28 LAB — PROTIME-INR
INR: 1.1 (ref 0.8–1.2)
Prothrombin Time: 13.8 seconds (ref 11.4–15.2)

## 2022-03-28 LAB — ETHANOL: Alcohol, Ethyl (B): <10 mg/dL (ref <10)

## 2022-03-28 LAB — SALICYLATE LEVEL: Salicylate Lvl: 17.4 mg/dL (ref 7.0–30.0)

## 2022-03-28 LAB — MAGNESIUM: Magnesium: 2 mg/dL (ref 1.7–2.4)

## 2022-03-28 LAB — ACETAMINOPHEN LEVEL: Acetaminophen (Tylenol), Serum: <10 ug/mL — ABNORMAL LOW (ref 10–30)

## 2022-03-28 LAB — APTT: aPTT: 31 seconds (ref 24–36)

## 2022-03-28 MED ORDER — LACTATED RINGERS IV SOLN: INTRAVENOUS | Status: DC

## 2022-03-28 MED ORDER — PANTOPRAZOLE 80MG IVPB - SIMPLE MED
80.0000 mg | Freq: Once | INTRAVENOUS | Status: AC
  Administered 2022-03-29: 80 mg via INTRAVENOUS
  Filled 2022-03-28: qty 100

## 2022-03-28 MED ORDER — VANCOMYCIN HCL 1250 MG/250ML IV SOLN
1250.0000 mg | Freq: Once | INTRAVENOUS | Status: AC
  Administered 2022-03-29: 1250 mg via INTRAVENOUS
  Filled 2022-03-28: qty 250

## 2022-03-28 MED ORDER — PANTOPRAZOLE INFUSION (NEW) - SIMPLE MED
8.0000 mg/h | INTRAVENOUS | Status: DC
  Administered 2022-03-29: 8 mg/h via INTRAVENOUS
  Filled 2022-03-28: qty 100

## 2022-03-28 MED ORDER — SODIUM CHLORIDE 0.9% IV SOLUTION
Freq: Once | INTRAVENOUS | Status: DC
  Filled 2022-03-28: qty 250

## 2022-03-28 MED ORDER — SODIUM CHLORIDE 0.9 % IV SOLN
1.0000 g | Freq: Once | INTRAVENOUS | Status: AC
  Administered 2022-03-29: 1 g via INTRAVENOUS
  Filled 2022-03-28: qty 10

## 2022-03-28 MED ORDER — METRONIDAZOLE 500 MG/100ML IV SOLN
500.0000 mg | Freq: Once | INTRAVENOUS | Status: AC
  Administered 2022-03-29: 500 mg via INTRAVENOUS
  Filled 2022-03-28: qty 100

## 2022-03-28 MED ORDER — IOHEXOL 300 MG/ML  SOLN
100.0000 mL | Freq: Once | INTRAMUSCULAR | Status: AC | PRN
  Administered 2022-03-28: 100 mL via INTRAVENOUS

## 2022-03-28 MED ORDER — LACTATED RINGERS IV BOLUS
1000.0000 mL | Freq: Once | INTRAVENOUS | Status: AC
  Administered 2022-03-29: 1000 mL via INTRAVENOUS

## 2022-03-28 MED ORDER — PANTOPRAZOLE SODIUM 40 MG IV SOLR: 40.0000 mg | Freq: Two times a day (BID) | INTRAVENOUS | Status: DC

## 2022-03-28 MED ORDER — FENTANYL CITRATE PF 50 MCG/ML IJ SOSY
50.0000 ug | PREFILLED_SYRINGE | Freq: Once | INTRAMUSCULAR | Status: AC
  Administered 2022-03-29: 50 ug via INTRAVENOUS
  Filled 2022-03-28: qty 1

## 2022-03-28 NOTE — Progress Notes (Addendum)
PHARMACY -  BRIEF ANTIBIOTIC NOTE   Pharmacy has received consult(s) for Vancomycin from an ED provider.  The patient's profile has been reviewed for ht/wt/allergies/indication/available labs.    One time order(s) placed for Vancomycin 1250 mg per pt wt 60.8 kg  Further antibiotics/pharmacy consults should be ordered by admitting physician if indicated.                       Thank you, Renda Rolls, PharmD, [facility] 03/28/2022 11:44 PM
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
## 2022-03-28 NOTE — Progress Notes (Signed)
CODE SEPSIS - PHARMACY COMMUNICATION  **Broad Spectrum Antibiotics should be administered within 1 hour of Sepsis diagnosis**  Time Code Sepsis Called/Page Received: 2242  Antibiotics Ordered: Ceftriaxone, Flagyl, Vancomycin  Time of 1st antibiotic administration: 5638, pt OTF for contrast imaging, delaying administration time.  If necessary, Name of Provider/Nurse Contacted: Francena Hanly, PharmD, [facility] 03/28/2022 11:43 PM
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
## 2022-03-29 ENCOUNTER — Other Ambulatory Visit: Payer: Self-pay

## 2022-03-29 LAB — LACTIC ACID, PLASMA: Lactic Acid, Venous: 1.2 mmol/L (ref 0.5–1.9)

## 2022-03-29 LAB — URINE DRUG SCREEN, QUALITATIVE (ARMC ONLY)
Amphetamines, Ur Screen: POSITIVE — AB
Barbiturates, Ur Screen: NOT DETECTED
Benzodiazepine, Ur Scrn: NOT DETECTED
Cannabinoid 50 Ng, Ur ~~LOC~~: NOT DETECTED
Cocaine Metabolite,Ur ~~LOC~~: POSITIVE — AB
MDMA (Ecstasy)Ur Screen: NOT DETECTED
Methadone Scn, Ur: NOT DETECTED
Opiate, Ur Screen: NOT DETECTED
Phencyclidine (PCP) Ur S: NOT DETECTED
Tricyclic, Ur Screen: NOT DETECTED

## 2022-03-29 LAB — CBC WITH DIFFERENTIAL/PLATELET
Abs Immature Granulocytes: 0.09 10*3/uL — ABNORMAL HIGH (ref 0.00–0.07)
Basophils Absolute: 0 10*3/uL (ref 0.0–0.1)
Basophils Relative: 0 %
Eosinophils Absolute: 0 10*3/uL (ref 0.0–0.5)
Eosinophils Relative: 0 %
HCT: 13.1 % — CL (ref 36.0–46.0)
Hemoglobin: 4 g/dL — CL (ref 12.0–15.0)
Immature Granulocytes: 1 %
Lymphocytes Relative: 24 %
Lymphs Abs: 3.5 10*3/uL (ref 0.7–4.0)
MCH: 24 pg — ABNORMAL LOW (ref 26.0–34.0)
MCHC: 30.5 g/dL (ref 30.0–36.0)
MCV: 78.4 fL — ABNORMAL LOW (ref 80.0–100.0)
Monocytes Absolute: 0.9 10*3/uL (ref 0.1–1.0)
Monocytes Relative: 6 %
Neutro Abs: 10.4 10*3/uL — ABNORMAL HIGH (ref 1.7–7.7)
Neutrophils Relative %: 69 %
Platelets: 487 10*3/uL — ABNORMAL HIGH (ref 150–400)
RBC: 1.67 MIL/uL — ABNORMAL LOW (ref 3.87–5.11)
RDW: 19.1 % — ABNORMAL HIGH (ref 11.5–15.5)
WBC: 15 10*3/uL — ABNORMAL HIGH (ref 4.0–10.5)
nRBC: 0.1 % (ref 0.0–0.2)

## 2022-03-29 LAB — TROPONIN I (HIGH SENSITIVITY)
Troponin I (High Sensitivity): 5 ng/L (ref <18)
Troponin I (High Sensitivity): 9 ng/L (ref <18)

## 2022-03-29 LAB — HEPATITIS PANEL, ACUTE
HCV Ab: REACTIVE — AB
Hep A IgM: NONREACTIVE
Hep B C IgM: NONREACTIVE
Hepatitis B Surface Ag: NONREACTIVE

## 2022-03-29 LAB — ABO/RH: ABO/RH(D): A POS

## 2022-03-29 LAB — HEMOGLOBIN AND HEMATOCRIT, BLOOD
HCT: 19.5 % — ABNORMAL LOW (ref 36.0–46.0)
Hemoglobin: 6.4 g/dL — ABNORMAL LOW (ref 12.0–15.0)

## 2022-03-29 LAB — PREPARE RBC (CROSSMATCH)

## 2022-03-29 LAB — HIV ANTIBODY (ROUTINE TESTING W REFLEX): HIV Screen 4th Generation wRfx: NONREACTIVE

## 2022-03-29 LAB — SARS CORONAVIRUS 2 BY RT PCR: SARS Coronavirus 2 by RT PCR: NEGATIVE

## 2022-03-29 LAB — PROCALCITONIN: Procalcitonin: 16.75 ng/mL

## 2022-03-29 LAB — LIPASE, BLOOD: Lipase: 38 U/L (ref 11–51)

## 2022-03-29 MED ORDER — FENTANYL CITRATE PF 50 MCG/ML IJ SOSY
50.0000 ug | PREFILLED_SYRINGE | Freq: Once | INTRAMUSCULAR | Status: AC
  Administered 2022-03-29: 50 ug via INTRAVENOUS
  Filled 2022-03-29: qty 1

## 2022-03-29 MED ORDER — SODIUM CHLORIDE 0.9% IV SOLUTION
Freq: Once | INTRAVENOUS | Status: DC
  Filled 2022-03-29: qty 250

## 2022-03-29 MED ORDER — DEXTROSE 5 % IV SOLN
15.0000 mg/kg/h | INTRAVENOUS | Status: DC
  Administered 2022-03-29: 15 mg/kg/h via INTRAVENOUS
  Filled 2022-03-29: qty 90

## 2022-03-29 MED ORDER — ONDANSETRON HCL 4 MG/2ML IJ SOLN
INTRAMUSCULAR | Status: AC
  Administered 2022-03-29: 4 mg
  Filled 2022-03-29: qty 2

## 2022-03-29 MED ORDER — LORAZEPAM 2 MG/ML IJ SOLN
1.0000 mg | Freq: Once | INTRAMUSCULAR | Status: AC
  Administered 2022-03-29: 1 mg via INTRAVENOUS
  Filled 2022-03-29: qty 1

## 2022-03-29 MED ORDER — ACETYLCYSTEINE LOAD VIA INFUSION: 150.0000 mg/kg | Freq: Once | INTRAVENOUS | Status: DC

## 2022-03-29 MED ORDER — ACETYLCYSTEINE LOAD VIA INFUSION
150.0000 mg/kg | Freq: Once | INTRAVENOUS | Status: AC
Start: 2022-03-29 — End: 2022-03-29
  Administered 2022-03-29: 9120 mg via INTRAVENOUS
  Filled 2022-03-29: qty 299

## 2022-03-29 NOTE — ED Notes (Signed)
PT ACCEPTED TO [facility] .Marland KitchenMarland Kitchen WAITING ON BED ASSIGNMENT

## 2022-03-29 NOTE — Progress Notes (Signed)
   03/29/22 0800  Clinical Encounter Type  Visited With Patient and family together  Visit Type Initial  Referral From Nurse  Consult/Referral To Chaplain   Chaplain responded to nurse page. Chaplain met with patient and family, Chaplain provided compassionate presence and reflective listening as patient and family spoke about health challenges. Chaplain provided AD paperwork as requested.

## 2022-03-29 NOTE — ED Notes (Signed)
3rd unit blood started by [facility] air transport.. Was unable to scan blood successfully so documented under emergency blood administration. Pt is leaving now to transfer to [facility].

## 2022-03-29 NOTE — ED Notes (Signed)
[facility] transport here for pt.

## 2022-03-29 NOTE — ED Notes (Signed)
REcalled [name])  still waiting on md TO RETURN CALL

## 2022-03-29 NOTE — ED Notes (Signed)
Acdepted to [facility] room 4120, [facility] to transport patient 0730

## 2022-03-29 NOTE — ED Notes (Signed)
Pt resting and sleeping in bed. Rate of blood was changed.

## 2022-03-29 NOTE — ED Notes (Signed)
Called [facility] transfer center [name]) for Shannon Julian, MD

## 2022-03-29 NOTE — ED Notes (Signed)
Spoke with Liane Comber from [place] transport. They are about 30 miles away. Called blood bank  to get 3rd unit before transport arrives. Per blood bank, waiting on result of H&H which was just sent. They will call this RN when blood ready.

## 2022-03-29 NOTE — Progress Notes (Signed)
Pt being followed by [facility] for Sepsis protocol. 

## 2022-03-30 LAB — BPAM RBC
Blood Product Expiration Date: 202307162359
Blood Product Expiration Date: 202307292359
Blood Product Expiration Date: 202307292359
Blood Product Expiration Date: 202307302359
Blood Product Expiration Date: 202307302359
ISSUE DATE / TIME: 202307030237
ISSUE DATE / TIME: 202307030627
ISSUE DATE / TIME: 202307030851
Unit Type and Rh: 600
Unit Type and Rh: 6200
Unit Type and Rh: 6200
Unit Type and Rh: 6200
Unit Type and Rh: 6200

## 2022-03-30 LAB — TYPE AND SCREEN
ABO/RH(D): A POS
Antibody Screen: NEGATIVE
Unit division: 0
Unit division: 0
Unit division: 0
Unit division: 0
Unit division: 0

## 2022-03-30 LAB — URINE CULTURE: Culture: <10000 — AB

## 2022-04-02 LAB — CULTURE, BLOOD (ROUTINE X 2)
Culture: NO GROWTH
Special Requests: ADEQUATE

## 2022-04-03 LAB — CULTURE, BLOOD (ROUTINE X 2)
Culture: NO GROWTH
Special Requests: ADEQUATE

## 2022-05-27 ENCOUNTER — Other Ambulatory Visit: Payer: Self-pay

## 2022-05-27 ENCOUNTER — Encounter: Payer: Self-pay | Admitting: Emergency Medicine

## 2022-05-27 DIAGNOSIS — R6884 Jaw pain: Secondary | ICD-10-CM | POA: Insufficient documentation
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
## 2022-05-27 NOTE — ED Notes (Signed)
Reviewed chart with Dr Cinda Quest; no orders to be completed at this time

## 2022-05-28 ENCOUNTER — Emergency Department
Admission: EM | Admit: 2022-05-28 | Discharge: 2022-05-28 | Disposition: A | Discharge status: Home / Self Care | Payer: Self-pay | Attending: Emergency Medicine | Admitting: Emergency Medicine

## 2022-05-28 DIAGNOSIS — R6884 Jaw pain: Secondary | ICD-10-CM

## 2022-05-28 MED ORDER — KETOROLAC TROMETHAMINE 30 MG/ML IJ SOLN
30.0000 mg | Freq: Once | INTRAMUSCULAR | Status: AC
Start: 2022-05-28 — End: 2022-05-28
  Administered 2022-05-28: 30 mg via INTRAMUSCULAR
  Filled 2022-05-28: qty 1

## 2022-05-28 MED ORDER — ACETAMINOPHEN 500 MG PO TABS
1000.0000 mg | ORAL_TABLET | Freq: Once | ORAL | Status: AC
  Administered 2022-05-28: 1000 mg via ORAL
  Filled 2022-05-28: qty 2

## 2022-05-28 NOTE — ED Notes (Signed)
Pt Dc to home. Dc instructions reviewed with all questions answered. Pt verbalizes understanding.  Pt ambulatory out of dept via ambulation with steady gait

## 2022-05-28 NOTE — ED Provider Notes (Signed)
[facility] Provider Note    Event Date/Time   First MD Initiated Contact with Patient 05/28/22 0019     (approximate)   History   Jaw Pain   HPI  SADAF PRZYBYSZ is a 36 y.o. female who presents to the ED for evaluation of Jaw Pain   I review a [facility] medical discharge summary from 7/7 where patient was admitted for a few days due to sepsis from a UTI, as well as a delayed jaw fracture with associated odontogenic infection.  She otherwise has a history of polysubstance abuse, meningioma and hep C.  Her nondisplaced longitudinal oblique fracture of the mandible was delayed by a couple weeks and she was seen by OMFS and managed nonsurgically.  Patient self-reports that after this admission when she "got better" she had an outpatient procedure 3 weeks ago to put a drain and plate/screws to her left mandible.  Reports the drain was removed about 1 week ago.  Patient reports the area to her left jaw where the drain was has been having some continued small-volume purulence that she was told was "normal."  She reports the clinic of the OMFS called in another prescription of Augmentin antibiotics that she picked up this afternoon.  Reports her surgeon is out of the office this week and can see her next week.  Patient reports that she was told that she needed to continue to use a soft/pured diet.  She reports that she knew this, but despite that, she went to dinner with her daughter this evening and had a chili cheese dog.  She reports that she "knows I should not have, but it was really good I like them."  While eating this chili cheese dog, she reports feeling a popping sensation to her left mandible so she presents to the ED.  She reports it feels better now.  Denies fevers or other complaints.   Physical Exam   Triage Vital Signs: ED Triage Vitals  Enc Vitals Group     BP 05/27/22 2141 (!) 138/125     Pulse Rate 05/27/22 2141 (!) 102     Resp 05/27/22 2141 18      Temp 05/27/22 2141 98.1 F (36.7 C)     Temp Source 05/27/22 2141 Oral     SpO2 05/27/22 2141 100 %     Weight 05/27/22 2130 140 lb (63.5 kg)     Height 05/27/22 2130 '5\' 2"'$  (1.575 m)     Head Circumference --      Peak Flow --      Pain Score 05/27/22 2130 9     Pain Loc --      Pain Edu? --      Excl. in [place]? --     Most recent vital signs: Vitals:   05/27/22 2141  BP: (!) 138/125  Pulse: (!) 102  Resp: 18  Temp: 98.1 F (36.7 C)  SpO2: 100%    General: Awake, no distress.  Look systemically well.  Laughing and watching videos on her phone.  Speaking and enunciating well with full mandibular range of motion.  Able to open her mouth widely and show me poor dentition. Band-Aid over the left mid mandible that I remove which shows a small 1 cm healing surgical wound with granulation tissue and a small head of purulence that is tender to palpation.  She reports that has been "been like that." CV:  Good peripheral perfusion.  Resp:  Normal effort.  Abd:  No distention.  MSK:  No deformity noted.  Neuro:  No focal deficits appreciated. Other:     ED Results / Procedures / Treatments   Labs (all labs ordered are listed, but only abnormal results are displayed) Labs Reviewed - No data to display  EKG   RADIOLOGY   Official radiology report(s): No results found.  PROCEDURES and INTERVENTIONS:  Procedures  Medications  acetaminophen (TYLENOL) tablet 1,000 mg (1,000 mg Oral Given 05/28/22 0056)  ketorolac (TORADOL) 30 MG/ML injection 30 mg (30 mg Intramuscular Given 05/28/22 0056)     IMPRESSION / MDM / ASSESSMENT AND PLAN / ED COURSE  I reviewed the triage vital signs and the nursing notes.  Differential diagnosis includes, but is not limited to, abscess, sepsis, hardware malfunction  {Patient presents with symptoms of an acute illness or injury that is potentially life-threatening.  36 year old female presents to the ED after a popping sensation to her left  mandible after inappropriately eating solid food when she was supposed to be adherent to a pured or liquid diet.  She look systemically well and is freely moving her mandible.  She reports subacute purulence for which she is on Augmentin at the direction of her OMFS.  Denies any fevers or systemic symptoms.  While her tachycardia is noted in triage she look systemically well, does not have systemic symptoms and is already started on antibiotics which I feel is appropriate.  She has no signs of any residual abscess or localized swelling on examination.  She has full range of motion and I doubt hardware malfunction.  She is requesting Tylenol and discharge, which I is reasonable.  We discussed the importance of taking her antibiotics and return precautions for the ED.  Discussed following up with her surgeon.      FINAL CLINICAL IMPRESSION(S) / ED DIAGNOSES   Final diagnoses:  Pain in mandible     Rx / DC Orders   ED Discharge Orders     None        Note:  This document was prepared using Dragon voice recognition software and may include unintentional dictation errors.   Vladimir Crofts, MD 05/28/22 [phone]

## 2022-05-28 NOTE — Discharge Instructions (Signed)
Take antibiotics when you get home.  Please finish this prescription of antibiotics as prescribed.  Follow-up with your surgeon in the clinic next week.
